# Patient Record
Sex: Female | Born: 1950 | Race: White | Hispanic: No | Marital: Married | Smoking: Never smoker
Health system: Southern US, Community
[De-identification: ages and names within clinical notes are randomized; demographics above are authoritative.]

## PROBLEM LIST (undated history)

## (undated) DIAGNOSIS — K219 Gastro-esophageal reflux disease without esophagitis: Secondary | ICD-10-CM

## (undated) DIAGNOSIS — F329 Major depressive disorder, single episode, unspecified: Secondary | ICD-10-CM

## (undated) DIAGNOSIS — R51 Headache: Secondary | ICD-10-CM

## (undated) DIAGNOSIS — G40909 Epilepsy, unspecified, not intractable, without status epilepticus: Secondary | ICD-10-CM

## (undated) DIAGNOSIS — B029 Zoster without complications: Secondary | ICD-10-CM

## (undated) DIAGNOSIS — E538 Deficiency of other specified B group vitamins: Secondary | ICD-10-CM

## (undated) DIAGNOSIS — R569 Unspecified convulsions: Secondary | ICD-10-CM

## (undated) DIAGNOSIS — F32A Depression, unspecified: Secondary | ICD-10-CM

## (undated) DIAGNOSIS — R519 Headache, unspecified: Secondary | ICD-10-CM

## (undated) DIAGNOSIS — E039 Hypothyroidism, unspecified: Secondary | ICD-10-CM

## (undated) DIAGNOSIS — G4730 Sleep apnea, unspecified: Secondary | ICD-10-CM

## (undated) DIAGNOSIS — E785 Hyperlipidemia, unspecified: Secondary | ICD-10-CM

## (undated) DIAGNOSIS — N200 Calculus of kidney: Secondary | ICD-10-CM

## (undated) DIAGNOSIS — F419 Anxiety disorder, unspecified: Secondary | ICD-10-CM

## (undated) DIAGNOSIS — E7800 Pure hypercholesterolemia, unspecified: Secondary | ICD-10-CM

## (undated) DIAGNOSIS — H5340 Unspecified visual field defects: Secondary | ICD-10-CM

## (undated) DIAGNOSIS — M1990 Unspecified osteoarthritis, unspecified site: Secondary | ICD-10-CM

## (undated) DIAGNOSIS — K589 Irritable bowel syndrome without diarrhea: Secondary | ICD-10-CM

## (undated) DIAGNOSIS — N393 Stress incontinence (female) (male): Secondary | ICD-10-CM

## (undated) HISTORY — PX: TONSILLECTOMY: SUR1361

## (undated) HISTORY — PX: CARPAL TUNNEL RELEASE: SHX101

---

## 2004-12-29 ENCOUNTER — Ambulatory Visit: Payer: Self-pay | Admitting: Internal Medicine

## 2005-04-23 ENCOUNTER — Ambulatory Visit: Payer: Self-pay | Admitting: Unknown Physician Specialty

## 2005-09-11 ENCOUNTER — Other Ambulatory Visit: Payer: Self-pay

## 2005-09-11 ENCOUNTER — Inpatient Hospital Stay: Payer: Self-pay | Admitting: Internal Medicine

## 2006-01-17 ENCOUNTER — Ambulatory Visit: Payer: Self-pay | Admitting: Internal Medicine

## 2007-03-27 ENCOUNTER — Ambulatory Visit: Payer: Self-pay | Admitting: Internal Medicine

## 2008-05-11 ENCOUNTER — Ambulatory Visit: Payer: Self-pay | Admitting: Internal Medicine

## 2009-06-16 ENCOUNTER — Ambulatory Visit: Payer: Self-pay | Admitting: Internal Medicine

## 2009-06-22 ENCOUNTER — Ambulatory Visit: Payer: Self-pay | Admitting: Internal Medicine

## 2010-01-21 ENCOUNTER — Emergency Department: Payer: Self-pay | Admitting: Unknown Physician Specialty

## 2010-02-01 ENCOUNTER — Ambulatory Visit: Payer: Self-pay | Admitting: Internal Medicine

## 2010-07-03 ENCOUNTER — Ambulatory Visit: Payer: Self-pay | Admitting: Unknown Physician Specialty

## 2010-08-15 ENCOUNTER — Ambulatory Visit: Payer: Self-pay | Admitting: Internal Medicine

## 2010-09-26 ENCOUNTER — Ambulatory Visit: Payer: Self-pay | Admitting: Internal Medicine

## 2011-07-10 ENCOUNTER — Ambulatory Visit: Payer: Self-pay | Admitting: Unknown Physician Specialty

## 2011-08-10 ENCOUNTER — Ambulatory Visit: Payer: Self-pay | Admitting: Internal Medicine

## 2011-09-14 ENCOUNTER — Ambulatory Visit: Payer: Self-pay | Admitting: Internal Medicine

## 2011-09-21 ENCOUNTER — Ambulatory Visit: Payer: Self-pay | Admitting: Internal Medicine

## 2011-11-14 ENCOUNTER — Ambulatory Visit: Payer: Self-pay | Admitting: Ophthalmology

## 2011-11-14 LAB — CREATININE, SERUM
Creatinine: 0.51 mg/dL — ABNORMAL LOW (ref 0.60–1.30)
EGFR (Non-African Amer.): >60

## 2012-10-01 ENCOUNTER — Ambulatory Visit: Payer: Self-pay | Admitting: Internal Medicine

## 2013-10-20 ENCOUNTER — Ambulatory Visit: Payer: Self-pay | Admitting: Internal Medicine

## 2014-11-10 ENCOUNTER — Ambulatory Visit
Admit: 2014-11-10 | Disposition: A | Discharge status: Home / Self Care | Payer: Self-pay | Attending: Internal Medicine | Admitting: Internal Medicine

## 2015-06-06 ENCOUNTER — Other Ambulatory Visit: Payer: Self-pay | Admitting: Nurse Practitioner

## 2015-06-06 DIAGNOSIS — R140 Abdominal distension (gaseous): Secondary | ICD-10-CM

## 2015-06-06 DIAGNOSIS — R1031 Right lower quadrant pain: Secondary | ICD-10-CM

## 2015-06-17 ENCOUNTER — Ambulatory Visit: Payer: Self-pay

## 2015-07-08 ENCOUNTER — Ambulatory Visit: Admit: 2015-07-08 | Payer: Self-pay | Admitting: Unknown Physician Specialty

## 2015-07-08 SURGERY — COLONOSCOPY WITH PROPOFOL
Anesthesia: General

## 2015-08-19 ENCOUNTER — Encounter: Payer: Self-pay | Admitting: *Deleted

## 2015-08-22 ENCOUNTER — Ambulatory Visit
Admission: RE | Admit: 2015-08-22 | Discharge: 2015-08-22 | Disposition: A | Discharge status: Home / Self Care | Payer: BLUE CROSS/BLUE SHIELD | Source: Ambulatory Visit | Attending: Unknown Physician Specialty | Admitting: Unknown Physician Specialty

## 2015-08-22 ENCOUNTER — Encounter
Admission: RE | Disposition: A | Discharge status: Home / Self Care | Payer: Self-pay | Source: Ambulatory Visit | Attending: Unknown Physician Specialty

## 2015-08-22 ENCOUNTER — Ambulatory Visit: Payer: BLUE CROSS/BLUE SHIELD | Admitting: Anesthesiology

## 2015-08-22 ENCOUNTER — Encounter: Payer: Self-pay | Admitting: *Deleted

## 2015-08-22 DIAGNOSIS — E7800 Pure hypercholesterolemia, unspecified: Secondary | ICD-10-CM | POA: Diagnosis not present

## 2015-08-22 DIAGNOSIS — E039 Hypothyroidism, unspecified: Secondary | ICD-10-CM | POA: Diagnosis not present

## 2015-08-22 DIAGNOSIS — R51 Headache: Secondary | ICD-10-CM | POA: Diagnosis not present

## 2015-08-22 DIAGNOSIS — K640 First degree hemorrhoids: Secondary | ICD-10-CM | POA: Insufficient documentation

## 2015-08-22 DIAGNOSIS — R1013 Epigastric pain: Secondary | ICD-10-CM | POA: Insufficient documentation

## 2015-08-22 DIAGNOSIS — E538 Deficiency of other specified B group vitamins: Secondary | ICD-10-CM | POA: Diagnosis not present

## 2015-08-22 DIAGNOSIS — G4730 Sleep apnea, unspecified: Secondary | ICD-10-CM | POA: Diagnosis not present

## 2015-08-22 DIAGNOSIS — Z800 Family history of malignant neoplasm of digestive organs: Secondary | ICD-10-CM | POA: Insufficient documentation

## 2015-08-22 DIAGNOSIS — Z87442 Personal history of urinary calculi: Secondary | ICD-10-CM | POA: Insufficient documentation

## 2015-08-22 DIAGNOSIS — H547 Unspecified visual loss: Secondary | ICD-10-CM | POA: Insufficient documentation

## 2015-08-22 DIAGNOSIS — Z79899 Other long term (current) drug therapy: Secondary | ICD-10-CM | POA: Insufficient documentation

## 2015-08-22 DIAGNOSIS — E785 Hyperlipidemia, unspecified: Secondary | ICD-10-CM | POA: Diagnosis not present

## 2015-08-22 DIAGNOSIS — N393 Stress incontinence (female) (male): Secondary | ICD-10-CM | POA: Diagnosis not present

## 2015-08-22 DIAGNOSIS — K210 Gastro-esophageal reflux disease with esophagitis: Secondary | ICD-10-CM | POA: Diagnosis not present

## 2015-08-22 DIAGNOSIS — R1084 Generalized abdominal pain: Secondary | ICD-10-CM | POA: Insufficient documentation

## 2015-08-22 DIAGNOSIS — D122 Benign neoplasm of ascending colon: Secondary | ICD-10-CM | POA: Insufficient documentation

## 2015-08-22 DIAGNOSIS — Z8669 Personal history of other diseases of the nervous system and sense organs: Secondary | ICD-10-CM | POA: Insufficient documentation

## 2015-08-22 DIAGNOSIS — Z1211 Encounter for screening for malignant neoplasm of colon: Secondary | ICD-10-CM | POA: Diagnosis present

## 2015-08-22 DIAGNOSIS — G40909 Epilepsy, unspecified, not intractable, without status epilepticus: Secondary | ICD-10-CM | POA: Diagnosis not present

## 2015-08-22 DIAGNOSIS — K589 Irritable bowel syndrome without diarrhea: Secondary | ICD-10-CM | POA: Insufficient documentation

## 2015-08-22 DIAGNOSIS — F329 Major depressive disorder, single episode, unspecified: Secondary | ICD-10-CM | POA: Diagnosis not present

## 2015-08-22 DIAGNOSIS — M1990 Unspecified osteoarthritis, unspecified site: Secondary | ICD-10-CM | POA: Insufficient documentation

## 2015-08-22 HISTORY — DX: Pure hypercholesterolemia, unspecified: E78.00

## 2015-08-22 HISTORY — DX: Unspecified osteoarthritis, unspecified site: M19.90

## 2015-08-22 HISTORY — DX: Calculus of kidney: N20.0

## 2015-08-22 HISTORY — DX: Unspecified convulsions: R56.9

## 2015-08-22 HISTORY — DX: Hypothyroidism, unspecified: E03.9

## 2015-08-22 HISTORY — DX: Sleep apnea, unspecified: G47.30

## 2015-08-22 HISTORY — DX: Headache: R51

## 2015-08-22 HISTORY — PX: ESOPHAGOGASTRODUODENOSCOPY (EGD) WITH PROPOFOL: SHX5813

## 2015-08-22 HISTORY — DX: Unspecified visual field defects: H53.40

## 2015-08-22 HISTORY — DX: Deficiency of other specified B group vitamins: E53.8

## 2015-08-22 HISTORY — DX: Depression, unspecified: F32.A

## 2015-08-22 HISTORY — DX: Gastro-esophageal reflux disease without esophagitis: K21.9

## 2015-08-22 HISTORY — DX: Stress incontinence (female) (male): N39.3

## 2015-08-22 HISTORY — DX: Hyperlipidemia, unspecified: E78.5

## 2015-08-22 HISTORY — DX: Major depressive disorder, single episode, unspecified: F32.9

## 2015-08-22 HISTORY — PX: COLONOSCOPY WITH PROPOFOL: SHX5780

## 2015-08-22 HISTORY — DX: Zoster without complications: B02.9

## 2015-08-22 HISTORY — DX: Headache, unspecified: R51.9

## 2015-08-22 HISTORY — DX: Epilepsy, unspecified, not intractable, without status epilepticus: G40.909

## 2015-08-22 HISTORY — DX: Irritable bowel syndrome, unspecified: K58.9

## 2015-08-22 SURGERY — COLONOSCOPY WITH PROPOFOL
Anesthesia: General

## 2015-08-22 MED ORDER — FENTANYL CITRATE (PF) 100 MCG/2ML IJ SOLN
INTRAMUSCULAR | Status: DC | PRN
  Administered 2015-08-22: 50 ug via INTRAVENOUS

## 2015-08-22 MED ORDER — LIDOCAINE HCL (CARDIAC) 20 MG/ML IV SOLN
INTRAVENOUS | Status: DC | PRN
  Administered 2015-08-22: 100 mg via INTRAVENOUS

## 2015-08-22 MED ORDER — ACETAMINOPHEN 500 MG PO TABS
ORAL_TABLET | ORAL | Status: AC
  Filled 2015-08-22: qty 2

## 2015-08-22 MED ORDER — GLYCOPYRROLATE 0.2 MG/ML IJ SOLN
INTRAMUSCULAR | Status: DC | PRN
  Administered 2015-08-22: 0.2 mg via INTRAVENOUS

## 2015-08-22 MED ORDER — PROPOFOL 500 MG/50ML IV EMUL
INTRAVENOUS | Status: DC | PRN
  Administered 2015-08-22: 70 ug/kg/min via INTRAVENOUS

## 2015-08-22 MED ORDER — PROPOFOL 10 MG/ML IV BOLUS
INTRAVENOUS | Status: DC | PRN
  Administered 2015-08-22: 80 mg via INTRAVENOUS
  Administered 2015-08-22: 13 mg via INTRAVENOUS

## 2015-08-22 MED ORDER — MIDAZOLAM HCL 5 MG/5ML IJ SOLN
INTRAMUSCULAR | Status: DC | PRN
  Administered 2015-08-22: 1 mg via INTRAVENOUS

## 2015-08-22 MED ORDER — SODIUM CHLORIDE 0.9 % IV SOLN: INTRAVENOUS | Status: DC

## 2015-08-22 MED ORDER — SODIUM CHLORIDE 0.9 % IV SOLN
INTRAVENOUS | Status: DC
  Administered 2015-08-22: 1000 mL via INTRAVENOUS

## 2015-08-22 NOTE — H&P (Signed)
Primary Care Physician:  Adin Hector, MD Primary Gastroenterologist:  Dr. Vira Agar  Pre-Procedure History & Physical: HPI:  Morgan Good is a 65 y.o. female is here for an endoscopy and colonoscopy.   Past Medical History  Diagnosis Date  . Arthritis   . B12 deficiency   . Depression   . Epilepsy ([place])   . GERD (gastroesophageal reflux disease)   . Shingles   . Hypothyroidism   . IBS (irritable bowel syndrome)   . Headache   . Hyperlipidemia   . Hypercholesterolemia   . Renal stones   . Seizures ([place])   . Sleep apnea   . Stress incontinence   . Hypothyroidism   . Visual field loss     History reviewed. No pertinent past surgical history.  Prior to Admission medications   Medication Sig Start Date End Date Taking? Authorizing Provider  lamoTRIgine (LAMICTAL) 100 MG tablet Take 100 mg by mouth daily.   Yes Historical Provider, MD  levothyroxine (SYNTHROID, LEVOTHROID) 75 MCG tablet Take 75 mcg by mouth daily before breakfast.   Yes Historical Provider, MD  Linaclotide (LINZESS) 145 MCG CAPS capsule Take 145 mcg by mouth daily.   Yes Historical Provider, MD  lovastatin (MEVACOR) 40 MG tablet Take 40 mg by mouth at bedtime.   Yes Historical Provider, MD  pantoprazole (PROTONIX) 40 MG tablet Take 40 mg by mouth daily.   Yes Historical Provider, MD  venlafaxine (EFFEXOR) 75 MG tablet Take 75 mg by mouth 2 (two) times daily.   Yes Historical Provider, MD  vitamin B-12 (CYANOCOBALAMIN) 1000 MCG tablet Take 1,000 mcg by mouth daily.   Yes Historical Provider, MD  zolpidem (AMBIEN) 5 MG tablet Take 5 mg by mouth at bedtime as needed for sleep.   Yes Historical Provider, MD  ondansetron (ZOFRAN-ODT) 4 MG disintegrating tablet Take 4 mg by mouth every 8 (eight) hours as needed for nausea or vomiting. Reported on 08/22/2015    Historical Provider, MD    Allergies as of 07/26/2015  . (Not on File)    History reviewed. No pertinent family history.  Social History   Social  History  . Marital Status: Married    Spouse Name: N/A  . Number of Children: N/A  . Years of Education: N/A   Occupational History  . Not on file.   Social History Main Topics  . Smoking status: Never Smoker   . Smokeless tobacco: Never Used  . Alcohol Use: 1.2 oz/week    2 Glasses of wine per week  . Drug Use: Not on file  . Sexual Activity: Not on file   Other Topics Concern  . Not on file   Social History Narrative    Review of Systems: See HPI, otherwise negative ROS  Physical Exam: BP 145/73 mmHg  Pulse 61  Temp(Src) 98.4 F (36.9 C) (Tympanic)  Resp 14  Ht 5\' 1"  (1.549 m)  Wt 64.864 kg (143 lb)  BMI 27.03 kg/m2  SpO2 100% General:   Alert,  pleasant and cooperative in NAD Head:  Normocephalic and atraumatic. Neck:  Supple; no masses or thyromegaly. Lungs:  Clear throughout to auscultation.    Heart:  Regular rate and rhythm. Abdomen:  Soft, nontender and nondistended. Normal bowel sounds, without guarding, and without rebound.   Neurologic:  Alert and  oriented x4;  grossly normal neurologically.  Impression/Plan: Morgan Good is here for an endoscopy and colonoscopy to be performed for [facility] in sister and GERD  and generalized abd pain  Risks, benefits, limitations, and alternatives regarding  endoscopy and colonoscopy have been reviewed with the patient.  Questions have been answered.  All parties agreeable.   Gaylyn Cheers, MD  08/22/2015, 3:27 PM

## 2015-08-22 NOTE — Op Note (Signed)
[facility] Patient Name: Morgan Good Procedure Date: 08/22/2015 3:30 PM MRN: [id number] Account #: [id number] Date of Birth: 1950/09/22 Admit Type: Outpatient Age: 65 Room: [facility] ENDO ROOM 1 Gender: Female Note Status: Finalized Procedure:         Upper GI endoscopy Indications:       Epigastric abdominal pain, Heartburn Providers:         Manya Silvas, MD Referring MD:      Ramonita Lab, MD (Referring MD) Medicines:         Propofol per Anesthesia Complications:     No immediate complications. Procedure:         Pre-Anesthesia Assessment:                    - After reviewing the risks and benefits, the patient was                     deemed in satisfactory condition to undergo the procedure.                    After obtaining informed consent, the endoscope was passed                     under direct vision. Throughout the procedure, the                     patient's blood pressure, pulse, and oxygen saturations                     were monitored continuously. The Endoscope was introduced                     through the mouth, and advanced to the second part of                     duodenum. The upper GI endoscopy was accomplished without                     difficulty. The patient tolerated the procedure well. Findings:      LA Grade A (one or more mucosal breaks less than 5 mm, not extending       between tops of 2 mucosal folds) esophagitis with no bleeding was found       39 cm from the incisors. Biopsies were taken from GEJ with a cold       forceps for histology.      The entire examined stomach was normal. Biopsies were taken with a cold       forceps for histology. Biopsies were taken with a cold forceps for       Helicobacter pylori testing.      Patchy mild inflammation characterized by erythema and granularity was       found in the duodenal bulb. Biopsies were taken with a cold forceps for       histology. Biopsies for histology  were taken with a cold forceps for for       evaluation of possible celiac disease.      The examined duodenum was normal. Biopsies for histology were taken with       a cold forceps for for evaluation of possible celiac disease. Impression:        - LA Grade A reflux esophagitis. Rule out Barrett's  esophagus. Biopsied.                    - Normal stomach. Biopsied.                    - Duodenitis. Biopsied.                    - Normal examined duodenum. Biopsied. Recommendation:    - Await pathology results. Manya Silvas, MD 08/22/2015 3:45:17 PM This report has been signed electronically. Number of Addenda: 0 Note Initiated On: 08/22/2015 3:30 PM      [facility]

## 2015-08-22 NOTE — Op Note (Signed)
[facility] Patient Name: Morgan Good Procedure Date: 08/22/2015 3:29 PM MRN: [id number] Account #: [id number] Date of Birth: 09/02/1950 Admit Type: Outpatient Age: 65 Room: [facility] ENDO ROOM 1 Gender: Female Note Status: Finalized Procedure:         Colonoscopy Indications:       Screening in patient at increased risk: Family history of                     1st-degree relative with colorectal cancer Providers:         Manya Silvas, MD Referring MD:      Ramonita Lab, MD (Referring MD) Medicines:         Propofol per Anesthesia Complications:     No immediate complications. Procedure:         Pre-Anesthesia Assessment:                    - After reviewing the risks and benefits, the patient was                     deemed in satisfactory condition to undergo the procedure.                    After obtaining informed consent, the colonoscope was                     passed under direct vision. Throughout the procedure, the                     patient's blood pressure, pulse, and oxygen saturations                     were monitored continuously. The Colonoscope was                     introduced through the anus and advanced to the the cecum,                     identified by appendiceal orifice and ileocecal valve. The                     colonoscopy was somewhat difficult due to a tortuous                     colon. The patient tolerated the procedure well. The                     quality of the bowel preparation was excellent. Findings:      A diminutive polyp was found in the ascending colon. The polyp was       sessile. The polyp was removed with a cold biopsy forceps. Resection and       retrieval were complete.      Internal hemorrhoids were found during endoscopy. The hemorrhoids were       small and Grade I (internal hemorrhoids that do not prolapse).      The exam was otherwise without abnormality. Impression:        - One diminutive polyp  in the ascending colon. Resected                     and retrieved.                    - Internal hemorrhoids.                    -  The examination was otherwise normal. Recommendation:    - Await pathology results. Manya Silvas, MD 08/22/2015 4:11:01 PM This report has been signed electronically. Number of Addenda: 0 Note Initiated On: 08/22/2015 3:29 PM Scope Withdrawal Time: 0 hours 13 minutes 49 seconds  Total Procedure Duration: 0 hours 21 minutes 56 seconds       [facility]

## 2015-08-22 NOTE — Anesthesia Preprocedure Evaluation (Signed)
Anesthesia Evaluation  Patient identified by MRN, date of birth, ID band Patient awake    Reviewed: Allergy & Precautions, H&P , NPO status , Patient's Chart, lab work & pertinent test results, reviewed documented beta blocker date and time   History of Anesthesia Complications Negative for: history of anesthetic complications  Airway Mallampati: II  TM Distance: >3 FB Neck ROM: full    Dental no notable dental hx. (+) Caps, Missing   Pulmonary neg shortness of breath, sleep apnea and Continuous Positive Airway Pressure Ventilation , neg COPD, neg recent URI,    Pulmonary exam normal breath sounds clear to auscultation       Cardiovascular Exercise Tolerance: Good negative cardio ROS Normal cardiovascular exam Rhythm:regular Rate:Normal     Neuro/Psych Seizures - (1 episode 6 years ago), Well Controlled,  PSYCHIATRIC DISORDERS (Depression) negative psych ROS   GI/Hepatic Neg liver ROS, GERD  Medicated and Controlled,  Endo/Other  neg diabetesHypothyroidism   Renal/GU Renal disease (kidney stones)  negative genitourinary   Musculoskeletal   Abdominal   Peds  Hematology negative hematology ROS (+)   Anesthesia Other Findings Past Medical History:   Arthritis                                                    B12 deficiency                                               Depression                                                   Epilepsy (HCC)                                               GERD (gastroesophageal reflux disease)                       Shingles                                                     Hypothyroidism                                               IBS (irritable bowel syndrome)                               Headache  Hyperlipidemia                                               Hypercholesterolemia                                         Renal  stones                                                 Seizures (HCC)                                               Sleep apnea                                                  Stress incontinence                                          Hypothyroidism                                               Visual field loss                                            Reproductive/Obstetrics negative OB ROS                             Anesthesia Physical Anesthesia Plan  ASA: II  Anesthesia Plan: General   Post-op Pain Management:    Induction:   Airway Management Planned:   Additional Equipment:   Intra-op Plan:   Post-operative Plan:   Informed Consent: I have reviewed the patients History and Physical, chart, labs and discussed the procedure including the risks, benefits and alternatives for the proposed anesthesia with the patient or authorized representative who has indicated his/her understanding and acceptance.   Dental Advisory Given  Plan Discussed with: Anesthesiologist, CRNA and Surgeon  Anesthesia Plan Comments:         Anesthesia Quick Evaluation

## 2015-08-22 NOTE — Anesthesia Postprocedure Evaluation (Signed)
Anesthesia Post Note  Patient: Morgan Good  Procedure(s) Performed: Procedure(s) (LRB): COLONOSCOPY WITH PROPOFOL (N/A) ESOPHAGOGASTRODUODENOSCOPY (EGD) WITH PROPOFOL (N/A)  Patient location during evaluation: Endoscopy Anesthesia Type: General Level of consciousness: awake Pain management: pain level controlled Respiratory status: spontaneous breathing Cardiovascular status: blood pressure returned to baseline Anesthetic complications: no    Last Vitals:  Filed Vitals:   08/22/15 1635 08/22/15 1645  BP: 119/76 139/85  Pulse: 72 68  Temp:    Resp: 12 17    Last Pain: There were no vitals filed for this visit.               Kayani Rapaport S

## 2015-08-22 NOTE — Transfer of Care (Signed)
Immediate Anesthesia Transfer of Care Note  Patient: Morgan Good  Procedure(s) Performed: Procedure(s): COLONOSCOPY WITH PROPOFOL (N/A) ESOPHAGOGASTRODUODENOSCOPY (EGD) WITH PROPOFOL (N/A)  Patient Location: PACU  Anesthesia Type:General  Level of Consciousness: sedated  Airway & Oxygen Therapy: Patient Spontanous Breathing and Patient connected to nasal cannula oxygen  Post-op Assessment: Report given to RN and Post -op Vital signs reviewed and stable  Post vital signs: Reviewed and stable  Last Vitals:  Filed Vitals:   08/22/15 1414 08/22/15 1616  BP: 145/73 124/76  Pulse: 61 76  Temp: 36.9 C 36 C  Resp: 14 17    Complications: No apparent anesthesia complications

## 2015-08-24 ENCOUNTER — Encounter: Payer: Self-pay | Admitting: Unknown Physician Specialty

## 2015-08-25 LAB — SURGICAL PATHOLOGY

## 2016-03-19 ENCOUNTER — Other Ambulatory Visit: Payer: Self-pay | Admitting: Internal Medicine

## 2016-03-19 DIAGNOSIS — E538 Deficiency of other specified B group vitamins: Secondary | ICD-10-CM | POA: Diagnosis not present

## 2016-03-19 DIAGNOSIS — R739 Hyperglycemia, unspecified: Secondary | ICD-10-CM | POA: Diagnosis not present

## 2016-03-19 DIAGNOSIS — Z1231 Encounter for screening mammogram for malignant neoplasm of breast: Secondary | ICD-10-CM

## 2016-03-19 DIAGNOSIS — R569 Unspecified convulsions: Secondary | ICD-10-CM | POA: Diagnosis not present

## 2016-03-19 DIAGNOSIS — K581 Irritable bowel syndrome with constipation: Secondary | ICD-10-CM | POA: Diagnosis not present

## 2016-03-19 DIAGNOSIS — F3342 Major depressive disorder, recurrent, in full remission: Secondary | ICD-10-CM | POA: Diagnosis not present

## 2016-03-19 DIAGNOSIS — Z1239 Encounter for other screening for malignant neoplasm of breast: Secondary | ICD-10-CM | POA: Diagnosis not present

## 2016-03-19 DIAGNOSIS — K219 Gastro-esophageal reflux disease without esophagitis: Secondary | ICD-10-CM | POA: Diagnosis not present

## 2016-03-19 DIAGNOSIS — E034 Atrophy of thyroid (acquired): Secondary | ICD-10-CM | POA: Diagnosis not present

## 2016-03-19 DIAGNOSIS — E7800 Pure hypercholesterolemia, unspecified: Secondary | ICD-10-CM | POA: Diagnosis not present

## 2016-03-19 DIAGNOSIS — G4733 Obstructive sleep apnea (adult) (pediatric): Secondary | ICD-10-CM | POA: Diagnosis not present

## 2016-03-19 DIAGNOSIS — D72818 Other decreased white blood cell count: Secondary | ICD-10-CM | POA: Diagnosis not present

## 2016-03-19 DIAGNOSIS — Z9989 Dependence on other enabling machines and devices: Secondary | ICD-10-CM | POA: Diagnosis not present

## 2016-04-06 ENCOUNTER — Ambulatory Visit
Admission: RE | Admit: 2016-04-06 | Discharge: 2016-04-06 | Disposition: A | Discharge status: Home / Self Care | Payer: PPO | Source: Ambulatory Visit | Attending: Internal Medicine | Admitting: Internal Medicine

## 2016-04-06 DIAGNOSIS — Z1231 Encounter for screening mammogram for malignant neoplasm of breast: Secondary | ICD-10-CM | POA: Diagnosis not present

## 2016-04-13 DIAGNOSIS — R238 Other skin changes: Secondary | ICD-10-CM | POA: Diagnosis not present

## 2016-04-13 DIAGNOSIS — L538 Other specified erythematous conditions: Secondary | ICD-10-CM | POA: Diagnosis not present

## 2016-04-13 DIAGNOSIS — L820 Inflamed seborrheic keratosis: Secondary | ICD-10-CM | POA: Diagnosis not present

## 2016-04-13 DIAGNOSIS — L821 Other seborrheic keratosis: Secondary | ICD-10-CM | POA: Diagnosis not present

## 2016-04-13 DIAGNOSIS — L298 Other pruritus: Secondary | ICD-10-CM | POA: Diagnosis not present

## 2016-04-13 DIAGNOSIS — Z1283 Encounter for screening for malignant neoplasm of skin: Secondary | ICD-10-CM | POA: Diagnosis not present

## 2016-04-13 DIAGNOSIS — D485 Neoplasm of uncertain behavior of skin: Secondary | ICD-10-CM | POA: Diagnosis not present

## 2016-05-01 DIAGNOSIS — S3992XA Unspecified injury of lower back, initial encounter: Secondary | ICD-10-CM | POA: Diagnosis not present

## 2016-05-01 DIAGNOSIS — M533 Sacrococcygeal disorders, not elsewhere classified: Secondary | ICD-10-CM | POA: Diagnosis not present

## 2016-05-28 DIAGNOSIS — D225 Melanocytic nevi of trunk: Secondary | ICD-10-CM | POA: Diagnosis not present

## 2016-05-28 DIAGNOSIS — D485 Neoplasm of uncertain behavior of skin: Secondary | ICD-10-CM | POA: Diagnosis not present

## 2016-07-20 DIAGNOSIS — H40053 Ocular hypertension, bilateral: Secondary | ICD-10-CM | POA: Diagnosis not present

## 2016-08-10 DIAGNOSIS — E7800 Pure hypercholesterolemia, unspecified: Secondary | ICD-10-CM | POA: Diagnosis not present

## 2016-08-10 DIAGNOSIS — E034 Atrophy of thyroid (acquired): Secondary | ICD-10-CM | POA: Diagnosis not present

## 2016-08-10 DIAGNOSIS — R739 Hyperglycemia, unspecified: Secondary | ICD-10-CM | POA: Diagnosis not present

## 2016-08-10 DIAGNOSIS — E538 Deficiency of other specified B group vitamins: Secondary | ICD-10-CM | POA: Diagnosis not present

## 2016-08-17 DIAGNOSIS — Z124 Encounter for screening for malignant neoplasm of cervix: Secondary | ICD-10-CM | POA: Diagnosis not present

## 2016-08-17 DIAGNOSIS — E7800 Pure hypercholesterolemia, unspecified: Secondary | ICD-10-CM | POA: Diagnosis not present

## 2016-08-17 DIAGNOSIS — D72818 Other decreased white blood cell count: Secondary | ICD-10-CM | POA: Diagnosis not present

## 2016-08-17 DIAGNOSIS — Z780 Asymptomatic menopausal state: Secondary | ICD-10-CM | POA: Diagnosis not present

## 2016-08-17 DIAGNOSIS — Z0000 Encounter for general adult medical examination without abnormal findings: Secondary | ICD-10-CM | POA: Diagnosis not present

## 2016-08-17 DIAGNOSIS — K581 Irritable bowel syndrome with constipation: Secondary | ICD-10-CM | POA: Diagnosis not present

## 2016-08-17 DIAGNOSIS — E034 Atrophy of thyroid (acquired): Secondary | ICD-10-CM | POA: Diagnosis not present

## 2016-08-17 DIAGNOSIS — G4733 Obstructive sleep apnea (adult) (pediatric): Secondary | ICD-10-CM | POA: Diagnosis not present

## 2016-08-17 DIAGNOSIS — R569 Unspecified convulsions: Secondary | ICD-10-CM | POA: Diagnosis not present

## 2016-08-17 DIAGNOSIS — R739 Hyperglycemia, unspecified: Secondary | ICD-10-CM | POA: Diagnosis not present

## 2016-08-17 DIAGNOSIS — E538 Deficiency of other specified B group vitamins: Secondary | ICD-10-CM | POA: Diagnosis not present

## 2016-08-17 DIAGNOSIS — K219 Gastro-esophageal reflux disease without esophagitis: Secondary | ICD-10-CM | POA: Diagnosis not present

## 2016-08-17 DIAGNOSIS — F3342 Major depressive disorder, recurrent, in full remission: Secondary | ICD-10-CM | POA: Diagnosis not present

## 2016-08-31 DIAGNOSIS — Z780 Asymptomatic menopausal state: Secondary | ICD-10-CM | POA: Diagnosis not present

## 2016-10-15 DIAGNOSIS — L821 Other seborrheic keratosis: Secondary | ICD-10-CM | POA: Diagnosis not present

## 2016-10-15 DIAGNOSIS — D485 Neoplasm of uncertain behavior of skin: Secondary | ICD-10-CM | POA: Diagnosis not present

## 2016-10-15 DIAGNOSIS — C4490 Unspecified malignant neoplasm of skin, unspecified: Secondary | ICD-10-CM | POA: Diagnosis not present

## 2016-10-15 DIAGNOSIS — Z86018 Personal history of other benign neoplasm: Secondary | ICD-10-CM | POA: Diagnosis not present

## 2016-11-13 DIAGNOSIS — D229 Melanocytic nevi, unspecified: Secondary | ICD-10-CM | POA: Diagnosis not present

## 2016-11-13 DIAGNOSIS — D485 Neoplasm of uncertain behavior of skin: Secondary | ICD-10-CM | POA: Diagnosis not present

## 2017-01-18 DIAGNOSIS — H40053 Ocular hypertension, bilateral: Secondary | ICD-10-CM | POA: Diagnosis not present

## 2017-01-21 DIAGNOSIS — K581 Irritable bowel syndrome with constipation: Secondary | ICD-10-CM | POA: Diagnosis not present

## 2017-01-21 DIAGNOSIS — K219 Gastro-esophageal reflux disease without esophagitis: Secondary | ICD-10-CM | POA: Diagnosis not present

## 2017-01-25 DIAGNOSIS — H43813 Vitreous degeneration, bilateral: Secondary | ICD-10-CM | POA: Diagnosis not present

## 2017-02-15 DIAGNOSIS — E538 Deficiency of other specified B group vitamins: Secondary | ICD-10-CM | POA: Diagnosis not present

## 2017-02-15 DIAGNOSIS — R569 Unspecified convulsions: Secondary | ICD-10-CM | POA: Diagnosis not present

## 2017-02-15 DIAGNOSIS — R739 Hyperglycemia, unspecified: Secondary | ICD-10-CM | POA: Diagnosis not present

## 2017-02-15 DIAGNOSIS — E034 Atrophy of thyroid (acquired): Secondary | ICD-10-CM | POA: Diagnosis not present

## 2017-02-15 DIAGNOSIS — N183 Chronic kidney disease, stage 3 (moderate): Secondary | ICD-10-CM | POA: Diagnosis not present

## 2017-02-15 DIAGNOSIS — E7800 Pure hypercholesterolemia, unspecified: Secondary | ICD-10-CM | POA: Diagnosis not present

## 2017-02-22 ENCOUNTER — Other Ambulatory Visit: Payer: Self-pay | Admitting: Internal Medicine

## 2017-02-22 DIAGNOSIS — G4733 Obstructive sleep apnea (adult) (pediatric): Secondary | ICD-10-CM | POA: Diagnosis not present

## 2017-02-22 DIAGNOSIS — E538 Deficiency of other specified B group vitamins: Secondary | ICD-10-CM | POA: Diagnosis not present

## 2017-02-22 DIAGNOSIS — D72818 Other decreased white blood cell count: Secondary | ICD-10-CM | POA: Diagnosis not present

## 2017-02-22 DIAGNOSIS — R739 Hyperglycemia, unspecified: Secondary | ICD-10-CM | POA: Diagnosis not present

## 2017-02-22 DIAGNOSIS — Z1231 Encounter for screening mammogram for malignant neoplasm of breast: Secondary | ICD-10-CM | POA: Diagnosis not present

## 2017-02-22 DIAGNOSIS — K219 Gastro-esophageal reflux disease without esophagitis: Secondary | ICD-10-CM | POA: Diagnosis not present

## 2017-02-22 DIAGNOSIS — R1084 Generalized abdominal pain: Secondary | ICD-10-CM | POA: Diagnosis not present

## 2017-02-22 DIAGNOSIS — E034 Atrophy of thyroid (acquired): Secondary | ICD-10-CM | POA: Diagnosis not present

## 2017-02-22 DIAGNOSIS — F3342 Major depressive disorder, recurrent, in full remission: Secondary | ICD-10-CM | POA: Diagnosis not present

## 2017-02-22 DIAGNOSIS — Z9989 Dependence on other enabling machines and devices: Secondary | ICD-10-CM | POA: Diagnosis not present

## 2017-02-22 DIAGNOSIS — R109 Unspecified abdominal pain: Secondary | ICD-10-CM | POA: Diagnosis not present

## 2017-02-22 DIAGNOSIS — R569 Unspecified convulsions: Secondary | ICD-10-CM | POA: Diagnosis not present

## 2017-02-22 DIAGNOSIS — E7800 Pure hypercholesterolemia, unspecified: Secondary | ICD-10-CM | POA: Diagnosis not present

## 2017-04-15 DIAGNOSIS — R490 Dysphonia: Secondary | ICD-10-CM | POA: Diagnosis not present

## 2017-04-15 DIAGNOSIS — J019 Acute sinusitis, unspecified: Secondary | ICD-10-CM | POA: Diagnosis not present

## 2017-04-15 DIAGNOSIS — J301 Allergic rhinitis due to pollen: Secondary | ICD-10-CM | POA: Diagnosis not present

## 2017-04-16 ENCOUNTER — Ambulatory Visit
Admission: RE | Admit: 2017-04-16 | Discharge: 2017-04-16 | Disposition: A | Discharge status: Home / Self Care | Payer: PPO | Source: Ambulatory Visit | Attending: Internal Medicine | Admitting: Internal Medicine

## 2017-04-16 DIAGNOSIS — Z1231 Encounter for screening mammogram for malignant neoplasm of breast: Secondary | ICD-10-CM | POA: Diagnosis not present

## 2017-04-23 ENCOUNTER — Other Ambulatory Visit: Payer: Self-pay | Admitting: Nurse Practitioner

## 2017-04-23 DIAGNOSIS — R1084 Generalized abdominal pain: Secondary | ICD-10-CM

## 2017-04-23 DIAGNOSIS — K581 Irritable bowel syndrome with constipation: Secondary | ICD-10-CM | POA: Diagnosis not present

## 2017-05-10 DIAGNOSIS — R1084 Generalized abdominal pain: Secondary | ICD-10-CM | POA: Diagnosis not present

## 2017-05-21 DIAGNOSIS — L821 Other seborrheic keratosis: Secondary | ICD-10-CM | POA: Diagnosis not present

## 2017-05-21 DIAGNOSIS — B001 Herpesviral vesicular dermatitis: Secondary | ICD-10-CM | POA: Diagnosis not present

## 2017-05-21 DIAGNOSIS — L578 Other skin changes due to chronic exposure to nonionizing radiation: Secondary | ICD-10-CM | POA: Diagnosis not present

## 2017-05-21 DIAGNOSIS — Z86018 Personal history of other benign neoplasm: Secondary | ICD-10-CM | POA: Diagnosis not present

## 2017-05-21 DIAGNOSIS — L570 Actinic keratosis: Secondary | ICD-10-CM | POA: Diagnosis not present

## 2017-07-11 ENCOUNTER — Other Ambulatory Visit: Payer: Self-pay | Admitting: Nurse Practitioner

## 2017-07-11 DIAGNOSIS — R1033 Periumbilical pain: Secondary | ICD-10-CM

## 2017-07-23 ENCOUNTER — Ambulatory Visit (HOSPITAL_COMMUNITY): Payer: PPO

## 2017-07-23 DIAGNOSIS — H40053 Ocular hypertension, bilateral: Secondary | ICD-10-CM | POA: Diagnosis not present

## 2017-07-25 ENCOUNTER — Ambulatory Visit
Admission: RE | Admit: 2017-07-25 | Discharge: 2017-07-25 | Disposition: A | Discharge status: Home / Self Care | Payer: PPO | Source: Ambulatory Visit | Attending: Nurse Practitioner | Admitting: Nurse Practitioner

## 2017-07-25 ENCOUNTER — Other Ambulatory Visit: Payer: Self-pay

## 2017-07-25 ENCOUNTER — Emergency Department
Admission: EM | Admit: 2017-07-25 | Discharge: 2017-07-25 | Disposition: A | Discharge status: Home / Self Care | Payer: PPO | Attending: Emergency Medicine | Admitting: Emergency Medicine

## 2017-07-25 DIAGNOSIS — R109 Unspecified abdominal pain: Secondary | ICD-10-CM | POA: Diagnosis not present

## 2017-07-25 DIAGNOSIS — D259 Leiomyoma of uterus, unspecified: Secondary | ICD-10-CM

## 2017-07-25 DIAGNOSIS — R1033 Periumbilical pain: Secondary | ICD-10-CM | POA: Insufficient documentation

## 2017-07-25 DIAGNOSIS — T7840XA Allergy, unspecified, initial encounter: Secondary | ICD-10-CM | POA: Insufficient documentation

## 2017-07-25 DIAGNOSIS — E039 Hypothyroidism, unspecified: Secondary | ICD-10-CM | POA: Diagnosis not present

## 2017-07-25 DIAGNOSIS — Z79899 Other long term (current) drug therapy: Secondary | ICD-10-CM | POA: Diagnosis not present

## 2017-07-25 DIAGNOSIS — I700 Atherosclerosis of aorta: Secondary | ICD-10-CM | POA: Insufficient documentation

## 2017-07-25 MED ORDER — FAMOTIDINE IN NACL 20-0.9 MG/50ML-% IV SOLN
20.0000 mg | Freq: Once | INTRAVENOUS | Status: AC
  Administered 2017-07-25: 20 mg via INTRAVENOUS
  Filled 2017-07-25: qty 50

## 2017-07-25 MED ORDER — IOPAMIDOL (ISOVUE-370) INJECTION 76%
75.0000 mL | Freq: Once | INTRAVENOUS | Status: AC | PRN
  Administered 2017-07-25: 75 mL via INTRAVENOUS

## 2017-07-25 MED ORDER — DIPHENHYDRAMINE HCL 50 MG/ML IJ SOLN
25.0000 mg | Freq: Once | INTRAMUSCULAR | Status: AC
  Administered 2017-07-25: 25 mg via INTRAVENOUS
  Filled 2017-07-25: qty 1

## 2017-07-25 MED ORDER — METHYLPREDNISOLONE SODIUM SUCC 125 MG IJ SOLR
125.0000 mg | Freq: Once | INTRAMUSCULAR | Status: AC
  Administered 2017-07-25: 125 mg via INTRAVENOUS
  Filled 2017-07-25: qty 2

## 2017-07-25 MED ORDER — PREDNISONE 10 MG PO TABS: ORAL_TABLET | ORAL | 0 refills | Status: AC

## 2017-07-25 NOTE — ED Notes (Signed)
Pt ambulatory upon discharge. Pt verbalized understanding of discharge instructions, follow-up care and when to come back to the ER if needed. VSS. A&O x4. Skin warm and dry.

## 2017-07-25 NOTE — ED Triage Notes (Signed)
Patient brought over from CT due to allergic reaction after IVP dye administration.  Patient with clear bilateral breath sounds.  Patient denies shortness of breath or feeling of throat swelling.  Patient does complain of itching all over with hives developing on bilateral arms, bilateral legs, and truck. Patient was pre-treated with PO  prednisone and benadryl  prior to scheduled  out patient CT of abdomen for worsening symptoms of IBS.

## 2017-07-25 NOTE — ED Provider Notes (Signed)
[facility] Emergency Department Provider Note ____________________________________________   I have reviewed the triage vital signs and the triage nursing note.  HISTORY  Chief Complaint Allergic Reaction   Historian Patient  HPI Morgan Good is a 66 y.o. female who came for an outpatient CT scan of her abdomen with contrast due to irritable bowel type symptoms, and because of a history of prior CT dye allergy, patient did premedication with prednisone 3 times overnight p.o. during prep, and during CT scan patient states that she started to have this symptoms of allergic reaction including stuffy nose and congestion as well as sneezing, no wheezing or throat closing.  After the CT was completed, she then developed hives in her upper extremities and lower extremities which are itchy.  Patient was brought to the ED for further management of allergic reaction to CT dye.  Symptoms are moderate and continuing.   Past Medical History:  Diagnosis Date  . Arthritis   . B12 deficiency   . Depression   . Epilepsy ([place])   . GERD (gastroesophageal reflux disease)   . Headache   . Hypercholesterolemia   . Hyperlipidemia   . Hypothyroidism   . Hypothyroidism   . IBS (irritable bowel syndrome)   . Renal stones   . Seizures ([place])   . Shingles   . Sleep apnea   . Stress incontinence   . Visual field loss     There are no active problems to display for this patient.   Past Surgical History:  Procedure Laterality Date  . COLONOSCOPY WITH PROPOFOL N/A 08/22/2015   Procedure: COLONOSCOPY WITH PROPOFOL;  Surgeon: Manya Silvas, MD;  Location: [facility] ENDOSCOPY;  Service: Endoscopy;  Laterality: N/A;  . ESOPHAGOGASTRODUODENOSCOPY (EGD) WITH PROPOFOL N/A 08/22/2015   Procedure: ESOPHAGOGASTRODUODENOSCOPY (EGD) WITH PROPOFOL;  Surgeon: Manya Silvas, MD;  Location: [facility] ENDOSCOPY;  Service: Endoscopy;  Laterality: N/A;    Prior to Admission medications    Medication Sig Start Date End Date Taking? Authorizing Provider  lamoTRIgine (LAMICTAL) 100 MG tablet Take 100 mg by mouth daily.   Yes [provider]  levothyroxine (SYNTHROID, LEVOTHROID) 75 MCG tablet Take 75 mcg by mouth daily before breakfast.   Yes [provider]  Linaclotide (LINZESS) 145 MCG CAPS capsule Take 145 mcg by mouth daily.   Yes [provider]  lovastatin (MEVACOR) 40 MG tablet Take 40 mg by mouth at bedtime.   Yes [provider]  pantoprazole (PROTONIX) 40 MG tablet Take 40 mg by mouth daily.   Yes [provider]  Polyethyl Glycol-Propyl Glycol (SYSTANE OP) Place 1-2 drops into both eyes 2 (two) times daily.   Yes [provider]  predniSONE (DELTASONE) 50 MG tablet Take 50 mg by mouth daily with breakfast.   Yes [provider]  venlafaxine (EFFEXOR) 75 MG tablet Take 150 mg by mouth 2 (two) times daily.    Yes [provider]  vitamin B-12 (CYANOCOBALAMIN) 1000 MCG tablet Take 1,000 mcg by mouth daily.   Yes [provider]  predniSONE (DELTASONE) 10 MG tablet 40 mg by mouth for 2 days 30 mg by mouth for 2 days 20 mg by mouth for 2 days 10 mg by mouth for 2 days. 07/25/17   Lisa Roca, MD  traZODone (DESYREL) 100 MG tablet Take 100 mg by mouth at bedtime as needed for sleep.    [provider]    Allergies  Allergen Reactions  . Contrast Media [Iodinated [employer]] Hives    Hives and sneezing even after premedication.   . Meloxicam Nausea And Vomiting  . Sulfa Antibiotics Itching    Family History  Problem Relation Age of Onset  . Breast cancer Sister 66    Social History Social History   Tobacco Use  . Smoking status: Never Smoker  . Smokeless tobacco: Never Used  Substance Use Topics  . Alcohol use: Yes    Alcohol/week: 1.2 oz    Types: 2 Glasses of wine per week  . Drug use: Not on file    Review of Systems  Constitutional: Negative for  fever. Eyes: Negative for visual changes. ENT: Negative for sore throat.  She was having sneezing which is now pretty much gone. Cardiovascular: Negative for chest pain. Respiratory: Negative for shortness of breath. Gastrointestinal: Negative for abdominal pain, vomiting and diarrhea. Genitourinary: Negative for dysuria. Musculoskeletal: Negative for back pain. Skin: Hives and itching to upper and lower extremities. Neurological: Negative for headache.  ____________________________________________   PHYSICAL EXAM:  VITAL SIGNS: ED Triage Vitals  Enc Vitals Group     BP      Pulse      Resp      Temp      Temp src      SpO2      Weight      Height      Head Circumference      Peak Flow      Pain Score      Pain Loc      Pain Edu?      Excl. in [place]?      Constitutional: Alert and oriented. Well appearing and in no distress. HEENT   Head: Normocephalic and atraumatic.      Eyes: Conjunctivae are normal. Pupils equal and round.       Ears:         Nose: No congestion/rhinnorhea.   Mouth/Throat: Mucous membranes are moist.   Neck: No stridor. Cardiovascular/Chest: Normal rate, regular rhythm.  No murmurs, rubs, or gallops. Respiratory: Normal respiratory effort without tachypnea nor retractions. Breath sounds are clear and equal bilaterally. No wheezes/rales/rhonchi. Gastrointestinal: Soft. No distention, no guarding, no rebound. Nontender.    Genitourinary/rectal:Deferred Musculoskeletal: Nontender with normal range of motion in all extremities. No joint effusions.  No lower extremity tenderness.  No edema. Neurologic:  Normal speech and language. No gross or focal neurologic deficits are appreciated. Skin: Mild redness/hives to the right upper extremity and lower extremities. Psychiatric: Mood and affect are normal. Speech and behavior are normal. Patient exhibits appropriate insight and judgment.   ____________________________________________  LABS  (pertinent positives/negatives) I, Lisa Roca, MD the attending physician have reviewed the labs noted below.  Labs Reviewed - No data to display  ____________________________________________    EKG I, Lisa Roca, MD, the attending physician have personally viewed and interpreted all ECGs.  None  _________________________________________  RADIOLOGY All Xrays were viewed by me.  Imaging interpreted by Radiologist, and I, Lisa Roca, MD the attending physician have reviewed the radiologist interpretation noted below.  None __________________________________________  PROCEDURES  Procedure(s) performed: None  Critical Care performed: None   ____________________________________________  ED COURSE / ASSESSMENT AND PLAN  Pertinent labs & imaging results that were available during my care of the patient were reviewed by me and considered in my medical decision making (see chart for details).   I came to the patient's bedside when the nurse informed the patient came over from CT with allergic  reaction to CT dye.  She did do a premedication protocol overnight, but developed sneezing/congestion without airway closing or wheezing, and now has hives.  She is currently not having any airway symptoms or hypotension, I am not suspicious of anaphylaxis at present.  However she is having a diffuse generalized reaction and I am to give her IV doses of Benadryl as well as Solu-Medrol and Pepcid.  We discussed she may take over-the-counter Pepcid for 5 days, as well as Benadryl use as directed on labeling for 5 days for any itching or rash.   Reevaluation around noon, patient has no symptoms and we discussed biphasic reactions, patient feels comfortable going home.  CONSULTATIONS:   None   Patient / Family / Caregiver informed of clinical course, medical decision-making process, and agree with plan.  I discussed return precautions, follow-up instructions, and discharge  instructions with patient and/or family.  Discharge Instructions : You are evaluated for allergic reaction to CT contrast dye.  Return to the emergency department immediately for any return or worsening of symptoms including any trouble breathing, wheezing, throat swelling, or any other symptoms concerning to you.    ___________________________________________   FINAL CLINICAL IMPRESSION(S) / ED DIAGNOSES   Final diagnoses:  Allergic reaction, initial encounter      ___________________________________________        Note: This dictation was prepared with Dragon dictation. Any transcriptional errors that result from this process are unintentional    Lisa Roca, MD 07/25/17 1203

## 2017-07-25 NOTE — Discharge Instructions (Signed)
You are evaluated for allergic reaction to CT contrast dye.  Return to the emergency department immediately for any return or worsening of symptoms including any trouble breathing, wheezing, throat swelling, or any other symptoms concerning to you.

## 2017-08-14 ENCOUNTER — Other Ambulatory Visit: Payer: Self-pay | Admitting: Nurse Practitioner

## 2017-08-14 DIAGNOSIS — R1011 Right upper quadrant pain: Secondary | ICD-10-CM

## 2017-08-14 DIAGNOSIS — R140 Abdominal distension (gaseous): Secondary | ICD-10-CM | POA: Diagnosis not present

## 2017-08-14 DIAGNOSIS — K5900 Constipation, unspecified: Secondary | ICD-10-CM | POA: Diagnosis not present

## 2017-08-14 DIAGNOSIS — R1084 Generalized abdominal pain: Secondary | ICD-10-CM | POA: Diagnosis not present

## 2017-08-20 DIAGNOSIS — E7800 Pure hypercholesterolemia, unspecified: Secondary | ICD-10-CM | POA: Diagnosis not present

## 2017-08-20 DIAGNOSIS — E034 Atrophy of thyroid (acquired): Secondary | ICD-10-CM | POA: Diagnosis not present

## 2017-08-20 DIAGNOSIS — R739 Hyperglycemia, unspecified: Secondary | ICD-10-CM | POA: Diagnosis not present

## 2017-08-20 DIAGNOSIS — E538 Deficiency of other specified B group vitamins: Secondary | ICD-10-CM | POA: Diagnosis not present

## 2017-08-28 DIAGNOSIS — G4733 Obstructive sleep apnea (adult) (pediatric): Secondary | ICD-10-CM | POA: Diagnosis not present

## 2017-08-28 DIAGNOSIS — F3342 Major depressive disorder, recurrent, in full remission: Secondary | ICD-10-CM | POA: Diagnosis not present

## 2017-08-28 DIAGNOSIS — E538 Deficiency of other specified B group vitamins: Secondary | ICD-10-CM | POA: Diagnosis not present

## 2017-08-28 DIAGNOSIS — Z0000 Encounter for general adult medical examination without abnormal findings: Secondary | ICD-10-CM | POA: Diagnosis not present

## 2017-08-28 DIAGNOSIS — R569 Unspecified convulsions: Secondary | ICD-10-CM | POA: Diagnosis not present

## 2017-08-28 DIAGNOSIS — R739 Hyperglycemia, unspecified: Secondary | ICD-10-CM | POA: Diagnosis not present

## 2017-08-28 DIAGNOSIS — E034 Atrophy of thyroid (acquired): Secondary | ICD-10-CM | POA: Diagnosis not present

## 2017-08-28 DIAGNOSIS — K219 Gastro-esophageal reflux disease without esophagitis: Secondary | ICD-10-CM | POA: Diagnosis not present

## 2017-08-28 DIAGNOSIS — E7800 Pure hypercholesterolemia, unspecified: Secondary | ICD-10-CM | POA: Diagnosis not present

## 2017-08-28 DIAGNOSIS — D72818 Other decreased white blood cell count: Secondary | ICD-10-CM | POA: Diagnosis not present

## 2017-08-28 DIAGNOSIS — Z9989 Dependence on other enabling machines and devices: Secondary | ICD-10-CM | POA: Diagnosis not present

## 2017-09-03 ENCOUNTER — Encounter
Admission: RE | Admit: 2017-09-03 | Discharge: 2017-09-03 | Disposition: A | Discharge status: Home / Self Care | Payer: PPO | Source: Ambulatory Visit | Attending: Nurse Practitioner | Admitting: Nurse Practitioner

## 2017-09-03 ENCOUNTER — Ambulatory Visit
Admission: RE | Admit: 2017-09-03 | Discharge: 2017-09-03 | Disposition: A | Discharge status: Home / Self Care | Payer: PPO | Source: Ambulatory Visit | Attending: Nurse Practitioner | Admitting: Nurse Practitioner

## 2017-09-03 DIAGNOSIS — R140 Abdominal distension (gaseous): Secondary | ICD-10-CM | POA: Insufficient documentation

## 2017-09-03 DIAGNOSIS — R1011 Right upper quadrant pain: Secondary | ICD-10-CM | POA: Insufficient documentation

## 2017-09-03 MED ORDER — TECHNETIUM TC 99M MEBROFENIN IV KIT
5.3900 | PACK | Freq: Once | INTRAVENOUS | Status: AC | PRN
  Administered 2017-09-03: 5.39 via INTRAVENOUS

## 2017-09-11 DIAGNOSIS — E034 Atrophy of thyroid (acquired): Secondary | ICD-10-CM | POA: Diagnosis not present

## 2017-09-11 DIAGNOSIS — F3342 Major depressive disorder, recurrent, in full remission: Secondary | ICD-10-CM | POA: Diagnosis not present

## 2017-09-11 DIAGNOSIS — E7800 Pure hypercholesterolemia, unspecified: Secondary | ICD-10-CM | POA: Diagnosis not present

## 2017-09-11 DIAGNOSIS — E538 Deficiency of other specified B group vitamins: Secondary | ICD-10-CM | POA: Diagnosis not present

## 2017-09-11 DIAGNOSIS — R739 Hyperglycemia, unspecified: Secondary | ICD-10-CM | POA: Diagnosis not present

## 2017-09-11 DIAGNOSIS — D72818 Other decreased white blood cell count: Secondary | ICD-10-CM | POA: Diagnosis not present

## 2017-09-11 DIAGNOSIS — K219 Gastro-esophageal reflux disease without esophagitis: Secondary | ICD-10-CM | POA: Diagnosis not present

## 2017-09-11 DIAGNOSIS — I700 Atherosclerosis of aorta: Secondary | ICD-10-CM | POA: Diagnosis not present

## 2017-09-11 DIAGNOSIS — G4733 Obstructive sleep apnea (adult) (pediatric): Secondary | ICD-10-CM | POA: Diagnosis not present

## 2017-09-11 DIAGNOSIS — R569 Unspecified convulsions: Secondary | ICD-10-CM | POA: Diagnosis not present

## 2017-09-11 DIAGNOSIS — Z9989 Dependence on other enabling machines and devices: Secondary | ICD-10-CM | POA: Diagnosis not present

## 2017-10-02 DIAGNOSIS — G4733 Obstructive sleep apnea (adult) (pediatric): Secondary | ICD-10-CM | POA: Diagnosis not present

## 2017-10-23 DIAGNOSIS — G4733 Obstructive sleep apnea (adult) (pediatric): Secondary | ICD-10-CM | POA: Diagnosis not present

## 2018-01-21 DIAGNOSIS — H40053 Ocular hypertension, bilateral: Secondary | ICD-10-CM | POA: Diagnosis not present

## 2018-01-27 DIAGNOSIS — H40053 Ocular hypertension, bilateral: Secondary | ICD-10-CM | POA: Diagnosis not present

## 2018-02-04 ENCOUNTER — Emergency Department: Payer: PPO

## 2018-02-04 ENCOUNTER — Emergency Department
Admission: EM | Admit: 2018-02-04 | Discharge: 2018-02-04 | Disposition: A | Discharge status: Home / Self Care | Payer: PPO | Attending: Emergency Medicine | Admitting: Emergency Medicine

## 2018-02-04 ENCOUNTER — Encounter: Payer: Self-pay | Admitting: Emergency Medicine

## 2018-02-04 ENCOUNTER — Other Ambulatory Visit: Payer: Self-pay

## 2018-02-04 DIAGNOSIS — R531 Weakness: Secondary | ICD-10-CM | POA: Diagnosis not present

## 2018-02-04 DIAGNOSIS — Z79899 Other long term (current) drug therapy: Secondary | ICD-10-CM | POA: Diagnosis not present

## 2018-02-04 DIAGNOSIS — R079 Chest pain, unspecified: Secondary | ICD-10-CM | POA: Diagnosis not present

## 2018-02-04 DIAGNOSIS — R410 Disorientation, unspecified: Secondary | ICD-10-CM | POA: Diagnosis present

## 2018-02-04 DIAGNOSIS — E039 Hypothyroidism, unspecified: Secondary | ICD-10-CM | POA: Diagnosis not present

## 2018-02-04 DIAGNOSIS — R4182 Altered mental status, unspecified: Secondary | ICD-10-CM | POA: Diagnosis not present

## 2018-02-04 LAB — URINALYSIS, COMPLETE (UACMP) WITH MICROSCOPIC
Bilirubin Urine: NEGATIVE
Glucose, UA: NEGATIVE mg/dL
HGB URINE DIPSTICK: NEGATIVE
KETONES UR: 5 mg/dL — AB
Leukocytes, UA: NEGATIVE
NITRITE: NEGATIVE
PH: 7 (ref 5.0–8.0)
Protein, ur: NEGATIVE mg/dL
Specific Gravity, Urine: 1.005 (ref 1.005–1.030)

## 2018-02-04 LAB — COMPREHENSIVE METABOLIC PANEL
ALBUMIN: 4.8 g/dL (ref 3.5–5.0)
ALT: 24 U/L (ref 0–44)
AST: 30 U/L (ref 15–41)
Alkaline Phosphatase: 56 U/L (ref 38–126)
Anion gap: 11 (ref 5–15)
BUN: 13 mg/dL (ref 8–23)
CHLORIDE: 105 mmol/L (ref 98–111)
CO2: 23 mmol/L (ref 22–32)
CREATININE: 0.94 mg/dL (ref 0.44–1.00)
Calcium: 9.4 mg/dL (ref 8.9–10.3)
GFR calc Af Amer: >60 mL/min (ref >60)
GFR calc non Af Amer: >60 mL/min (ref >60)
GLUCOSE: 92 mg/dL (ref 70–99)
POTASSIUM: 3.6 mmol/L (ref 3.5–5.1)
Sodium: 139 mmol/L (ref 135–145)
Total Bilirubin: 0.9 mg/dL (ref 0.3–1.2)
Total Protein: 7.5 g/dL (ref 6.5–8.1)

## 2018-02-04 LAB — CBC
HEMATOCRIT: 40 % (ref 35.0–47.0)
Hemoglobin: 13.8 g/dL (ref 12.0–16.0)
MCH: 34.5 pg — ABNORMAL HIGH (ref 26.0–34.0)
MCHC: 34.6 g/dL (ref 32.0–36.0)
MCV: 99.8 fL (ref 80.0–100.0)
Platelets: 211 10*3/uL (ref 150–440)
RBC: 4.01 MIL/uL (ref 3.80–5.20)
RDW: 12.4 % (ref 11.5–14.5)
WBC: 8.3 10*3/uL (ref 3.6–11.0)

## 2018-02-04 LAB — TROPONIN I: Troponin I: <0.03 ng/mL (ref <0.03)

## 2018-02-04 LAB — LIPASE, BLOOD: LIPASE: 46 U/L (ref 11–51)

## 2018-02-04 MED ORDER — LAMOTRIGINE 25 MG PO TABS
50.0000 mg | ORAL_TABLET | Freq: Once | ORAL | Status: AC
  Administered 2018-02-04: 50 mg via ORAL
  Filled 2018-02-04: qty 2

## 2018-02-04 MED ORDER — LAMOTRIGINE 25 MG PO TABS: 25.0000 mg | ORAL_TABLET | Freq: Two times a day (BID) | ORAL | 2 refills | Status: DC

## 2018-02-04 NOTE — ED Triage Notes (Signed)
Pt to ED from [facility] c/o abd pain for a few days, denies urinary symptoms, states seizure years ago and on medications but "feels like she did before the last one".  States disoriented and shaking.  Patient A&Ox4 in triage, speaking in complete and coherent sentences.

## 2018-02-04 NOTE — ED Notes (Signed)
Pt ambulatory to bathroom with standby assist.

## 2018-02-04 NOTE — Discharge Instructions (Addendum)
Please give Dr. Trena Platt office a call in the morning to set up follow-up.  Return here if you feel sicker.  We will increase your Lamictal by 50 mg a day.  To do this we will have you take 1 of the 100 mg pills twice a day and 1 of the 25 mg pills twice a day.

## 2018-02-04 NOTE — ED Provider Notes (Signed)
[facility] Emergency Department Provider Note   ____________________________________________   First MD Initiated Contact with Patient 02/04/18 1739     (approximate)  I have reviewed the triage vital signs and the nursing notes.   HISTORY  Chief Complaint Abdominal Pain    HPI Morgan Good is a 67 y.o. female patient reportedly has been not feeling like herself.  She is been feeling disoriented and feeling like she is being pulled down she says.  Today she was very shaky and seemed disoriented so her husband brought her here.  Her husband reports this is what she acted like before she had her last grand mal seizure which was several years ago.  At that time she was seen at [facility] placed on Lamictal and got better.  She still taking the Lamictal.  Her husband said she had some small seizures associated with the large tonic-clonic seizure.  This is what she acted like when she was having the small seizures.  In reviewing the past records I am unable to review the neurology notes from [place].   Past Medical History:  Diagnosis Date  . Arthritis   . B12 deficiency   . Depression   . Epilepsy ([place])   . GERD (gastroesophageal reflux disease)   . Headache   . Hypercholesterolemia   . Hyperlipidemia   . Hypothyroidism   . Hypothyroidism   . IBS (irritable bowel syndrome)   . Renal stones   . Seizures ([place])   . Shingles   . Sleep apnea   . Stress incontinence   . Visual field loss     There are no active problems to display for this patient.   Past Surgical History:  Procedure Laterality Date  . COLONOSCOPY WITH PROPOFOL N/A 08/22/2015   Procedure: COLONOSCOPY WITH PROPOFOL;  Surgeon: Manya Silvas, MD;  Location: [facility] ENDOSCOPY;  Service: Endoscopy;  Laterality: N/A;  . ESOPHAGOGASTRODUODENOSCOPY (EGD) WITH PROPOFOL N/A 08/22/2015   Procedure: ESOPHAGOGASTRODUODENOSCOPY (EGD) WITH PROPOFOL;  Surgeon: Manya Silvas, MD;  Location: [facility] ENDOSCOPY;   Service: Endoscopy;  Laterality: N/A;    Prior to Admission medications   Medication Sig Start Date End Date Taking? Authorizing Provider  lamoTRIgine (LAMICTAL) 100 MG tablet Take 100 mg by mouth daily.    [provider]  lamoTRIgine (LAMICTAL) 25 MG tablet Take 1 tablet (25 mg total) by mouth 2 (two) times daily. 02/04/18 02/04/19  Nena Polio, MD  levothyroxine (SYNTHROID, LEVOTHROID) 75 MCG tablet Take 75 mcg by mouth daily before breakfast.    [provider]  Linaclotide (LINZESS) 145 MCG CAPS capsule Take 145 mcg by mouth daily.    [provider]  lovastatin (MEVACOR) 40 MG tablet Take 40 mg by mouth at bedtime.    [provider]  pantoprazole (PROTONIX) 40 MG tablet Take 40 mg by mouth daily.    [provider]  Polyethyl Glycol-Propyl Glycol (SYSTANE OP) Place 1-2 drops into both eyes 2 (two) times daily.    [provider]  predniSONE (DELTASONE) 10 MG tablet 40 mg by mouth for 2 days 30 mg by mouth for 2 days 20 mg by mouth for 2 days 10 mg by mouth for 2 days. 07/25/17   Lisa Roca, MD  predniSONE (DELTASONE) 50 MG tablet Take 50 mg by mouth daily with breakfast.    [provider]  traZODone (DESYREL) 100 MG tablet Take 100 mg by mouth at bedtime as needed for sleep.  [provider]  venlafaxine (EFFEXOR) 75 MG tablet Take 150 mg by mouth 2 (two) times daily.     [provider]  vitamin B-12 (CYANOCOBALAMIN) 1000 MCG tablet Take 1,000 mcg by mouth daily.    [provider]    Allergies Contrast media [iodinated diagnostic agents]; Meloxicam; and Sulfa antibiotics  Family History  Problem Relation Age of Onset  . Breast cancer Sister 56    Social History Social History   Tobacco Use  . Smoking status: Never Smoker  . Smokeless tobacco: Never Used  Substance Use Topics  . Alcohol use: Yes    Alcohol/week: 1.2 oz    Types: 2 Glasses of wine per week  . Drug use: Never     Review of Systems  Constitutional: No fever/chills Eyes: No visual changes. ENT: No sore throat. Cardiovascular: Denies chest pain. Respiratory: Denies shortness of breath. Gastrointestinal: No abdominal pain.  No nausea, no vomiting.  No diarrhea.  No constipation. Genitourinary: Negative for dysuria. Musculoskeletal: Negative for back pain. Skin: Negative for rash. Neurological: Negative for headaches, focal weakness  ____________________________________________   PHYSICAL EXAM:  VITAL SIGNS: ED Triage Vitals [02/04/18 1724]  Enc Vitals Group     BP 130/65     Pulse Rate 71     Resp 18     Temp 98.3 F (36.8 C)     Temp Source Oral     SpO2 100 %     Weight 123 lb (55.8 kg)     Height 5' (1.524 m)     Head Circumference      Peak Flow      Pain Score 5     Pain Loc      Pain Edu?      Excl. in [place]?     Constitutional: Alert and oriented. Well appearing and in no acute distress. Eyes: Conjunctivae are normal. PERRL. EOMI. fundi appear normal Head: Atraumatic. Nose: No congestion/rhinnorhea. Mouth/Throat: Mucous membranes are moist.  Oropharynx non-erythematous.  Ears appear normal TMs are clear Neck: No stridor.  Cardiovascular: Normal rate, regular rhythm. Grossly normal heart sounds.  Good peripheral circulation. Respiratory: Normal respiratory effort.  No retractions. Lungs CTAB. Gastrointestinal: Soft and nontender. No distention. No abdominal bruits. No CVA tenderness. Musculoskeletal: No lower extremity tenderness nor edema.  No joint effusions.  Back is tender in the lumbar area over the spine this is apparently old. Neurologic:  Normal speech and language. No gross focal neurologic deficits are appreciated.  Cranial nerves II through XII are intact although visual fields were not checked cerebellar finger-to-nose and rapid alternating movements are slightly slowed bilaterally but basically intact.  There is no weakness that I can elicit and there is no  numbness. Skin:  Skin is warm, dry and intact. No rash noted. Psychiatric: Mood and affect are normal. Speech and behavior are normal.  ____________________________________________   LABS (all labs ordered are listed, but only abnormal results are displayed)  Labs Reviewed  CBC - Abnormal; Notable for the following components:      Result Value   MCH 34.5 (*)    All other components within normal limits  URINALYSIS, COMPLETE (UACMP) WITH MICROSCOPIC - Abnormal; Notable for the following components:   Color, Urine YELLOW (*)    APPearance CLEAR (*)    Ketones, ur 5 (*)    Bacteria, UA RARE (*)    All other components within normal limits  LIPASE, BLOOD  COMPREHENSIVE METABOLIC PANEL  TROPONIN I  LAMOTRIGINE  LEVEL   ____________________________________________  EKG  EKG read and interpreted by me shows normal sinus rhythm rate of 61 left axis no acute ST-T wave changes ____________________________________________  RADIOLOGY  ED MD interpretation:    Official radiology report(s): Dg Chest 2 View  Result Date: 02/04/2018 CLINICAL DATA:  Abdominal pain for a few days, chest pain under ribs when sitting, history of seizure disorder EXAM: CHEST - 2 VIEW COMPARISON:  01/21/2010 FINDINGS: Normal heart size, mediastinal contours, and pulmonary vascularity. Atherosclerotic calcification aorta. Lungs clear. No pulmonary infiltrate, pleural effusion or pneumothorax. Bones demineralized. IMPRESSION: No acute abnormalities. Electronically Signed   By: Lavonia Dana M.D.   On: 02/04/2018 18:20   Ct Head Wo Contrast  Result Date: 02/04/2018 CLINICAL DATA:  Altered mental status with disorientation. History of seizures. EXAM: CT HEAD WITHOUT CONTRAST TECHNIQUE: Contiguous axial images were obtained from the base of the skull through the vertex without intravenous contrast. COMPARISON:  Head CT January 21, 2010 and brain MRI November 14, 2011 FINDINGS: Brain: The ventricles are normal in size and  configuration. There is no intracranial mass, hemorrhage, extra-axial fluid collection, or midline shift. Patchy areas of decreased attenuation are seen throughout the periventricular white matter in the centra semiovale bilaterally. The appearance is consistent with either small vessel vascular disease or demyelination. Both entities may be present concurrently. Lesions of this nature were present on prior MR. No acute appearing infarct is evident on this study. Vascular: No hyperdense vessel evident. There is slight calcification in the left carotid siphon region. Skull: Bony calvarium appears intact. Sinuses/Orbits: Visualized paranasal sinuses are clear. Visualized orbits appear symmetric bilaterally. Other: Mastoid air cells are clear. IMPRESSION: Patchy areas of decreased attenuation in the periventricular white matter. Questions small vessel disease versus demyelination. Both entities may be present concurrently. No acute appearing infarct is demonstrable. No mass or hemorrhage evident. There is slight arterial vascular calcification. Electronically Signed   By: Lowella Grip III M.D.   On: 02/04/2018 18:21    ____________________________________________   PROCEDURES  Procedure(s) performed:   Procedures  Cr ____________________________________________   INITIAL IMPRESSION / ASSESSMENT AND PLAN / ED COURSE  Discussed patient with Dr. Irish Elders neurology.  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  He wants to increase her Lamictal by 50 mg and have her follow-up with outpatient neurology.  Outpatient neurology can decide if they need an MRI.  Patient is comfortable with this.  We will increase her Lamictal by 50 mg and let her go.  I have given her referral for Dr. Manuella Ghazi.  She will return here for any further problems Clinical Course as of Feb 05 2031  Tue Feb 04, 2018  1944 Leukocytes, UA: NEGATIVE [PM]    Clinical Course User Index [PM] Nena Polio, MD     ____________________________________________   FINAL CLINICAL IMPRESSION(S) / ED DIAGNOSES  Final diagnoses:  Weakness     ED Discharge Orders        Ordered    lamoTRIgine (LAMICTAL) 25 MG tablet  2 times daily     02/04/18 1939       Note:  This document was prepared using Dragon voice recognition software and may include unintentional dictation errors.    Nena Polio, MD 02/04/18 2033

## 2018-02-08 LAB — LAMOTRIGINE LEVEL: Lamotrigine Lvl: 3.2 ug/mL (ref 2.0–20.0)

## 2018-02-18 ENCOUNTER — Other Ambulatory Visit: Payer: Self-pay | Admitting: Neurology

## 2018-02-18 DIAGNOSIS — G40219 Localization-related (focal) (partial) symptomatic epilepsy and epileptic syndromes with complex partial seizures, intractable, without status epilepticus: Secondary | ICD-10-CM | POA: Diagnosis not present

## 2018-02-18 DIAGNOSIS — G43011 Migraine without aura, intractable, with status migrainosus: Secondary | ICD-10-CM | POA: Diagnosis not present

## 2018-02-18 DIAGNOSIS — Z79899 Other long term (current) drug therapy: Secondary | ICD-10-CM | POA: Diagnosis not present

## 2018-02-18 DIAGNOSIS — R9082 White matter disease, unspecified: Secondary | ICD-10-CM | POA: Diagnosis not present

## 2018-02-19 IMAGING — NM NM HEPATO W/GB/PHARM/[PERSON_NAME]
2 series · 12 of 12 positions shown · non-contrast
Comparison: 09/03/2017 ultrasound.

CLINICAL DATA: 66-year-old female with abdominal pain right upper
quadrant for 6 weeks. Subsequent encounter.

EXAM:
NUCLEAR MEDICINE HEPATOBILIARY IMAGING WITH GALLBLADDER EF
TECHNIQUE: Sequential images of the abdomen were obtained [DATE] minutes
following intravenous administration of radiopharmaceutical. After
oral ingestion of Ensure, gallbladder ejection fraction was
determined. At 60 min, normal ejection fraction is greater than 33%.
RADIOPHARMACEUTICALS:  5.39 mCi 2c-33m  Choletec IV

[Series 1000: hepatobiliary scan · 9.59mm/px · 6 of 60 frames shown]
[frame 6/60]
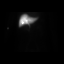
[frame 16/60]
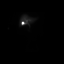
[frame 26/60]
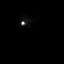
[frame 36/60]
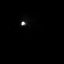
[frame 46/60]
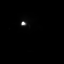
[frame 56/60]
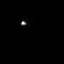

[Series 1000: gallbladder ef · 4.80mm/px · 6 of 120 frames shown]
[frame 11/120]
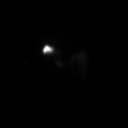
[frame 31/120]
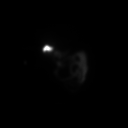
[frame 51/120]
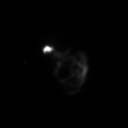
[frame 71/120]
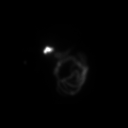
[frame 91/120]
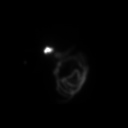
[frame 111/120]
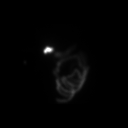

[12 of 12 positions shown; findings below may reference images not displayed]

FINDINGS: Prompt uptake and biliary excretion of activity by the liver is
seen. Gallbladder activity is visualized, consistent with patency of
cystic duct. Biliary activity passes into small bowel, consistent
with patent common bile duct.

Calculated gallbladder ejection fraction is 68%%. (Normal
gallbladder ejection fraction with Ensure is greater than 33%.)
IMPRESSION: Normal gallbladder ejection fraction.

## 2018-02-27 ENCOUNTER — Other Ambulatory Visit: Payer: Self-pay | Admitting: Neurology

## 2018-02-27 ENCOUNTER — Ambulatory Visit
Admission: RE | Admit: 2018-02-27 | Discharge: 2018-02-27 | Disposition: A | Discharge status: Home / Self Care | Payer: PPO | Source: Ambulatory Visit | Attending: Neurology | Admitting: Neurology

## 2018-02-27 DIAGNOSIS — G40219 Localization-related (focal) (partial) symptomatic epilepsy and epileptic syndromes with complex partial seizures, intractable, without status epilepticus: Secondary | ICD-10-CM

## 2018-02-27 DIAGNOSIS — R4182 Altered mental status, unspecified: Secondary | ICD-10-CM | POA: Diagnosis not present

## 2018-02-27 DIAGNOSIS — R569 Unspecified convulsions: Secondary | ICD-10-CM | POA: Diagnosis not present

## 2018-02-27 MED ORDER — GADOBENATE DIMEGLUMINE 529 MG/ML IV SOLN: 10.0000 mL | Freq: Once | INTRAVENOUS | Status: DC | PRN

## 2018-03-22 DIAGNOSIS — G40219 Localization-related (focal) (partial) symptomatic epilepsy and epileptic syndromes with complex partial seizures, intractable, without status epilepticus: Secondary | ICD-10-CM | POA: Diagnosis not present

## 2018-03-25 DIAGNOSIS — F3342 Major depressive disorder, recurrent, in full remission: Secondary | ICD-10-CM | POA: Diagnosis not present

## 2018-03-25 DIAGNOSIS — R569 Unspecified convulsions: Secondary | ICD-10-CM | POA: Diagnosis not present

## 2018-03-25 DIAGNOSIS — E7800 Pure hypercholesterolemia, unspecified: Secondary | ICD-10-CM | POA: Diagnosis not present

## 2018-03-25 DIAGNOSIS — I700 Atherosclerosis of aorta: Secondary | ICD-10-CM | POA: Diagnosis not present

## 2018-03-25 DIAGNOSIS — G4733 Obstructive sleep apnea (adult) (pediatric): Secondary | ICD-10-CM | POA: Diagnosis not present

## 2018-03-25 DIAGNOSIS — D72818 Other decreased white blood cell count: Secondary | ICD-10-CM | POA: Diagnosis not present

## 2018-03-25 DIAGNOSIS — R739 Hyperglycemia, unspecified: Secondary | ICD-10-CM | POA: Diagnosis not present

## 2018-03-25 DIAGNOSIS — E538 Deficiency of other specified B group vitamins: Secondary | ICD-10-CM | POA: Diagnosis not present

## 2018-03-25 DIAGNOSIS — Z9989 Dependence on other enabling machines and devices: Secondary | ICD-10-CM | POA: Diagnosis not present

## 2018-03-25 DIAGNOSIS — G40219 Localization-related (focal) (partial) symptomatic epilepsy and epileptic syndromes with complex partial seizures, intractable, without status epilepticus: Secondary | ICD-10-CM | POA: Diagnosis not present

## 2018-03-25 DIAGNOSIS — E034 Atrophy of thyroid (acquired): Secondary | ICD-10-CM | POA: Diagnosis not present

## 2018-03-25 DIAGNOSIS — K219 Gastro-esophageal reflux disease without esophagitis: Secondary | ICD-10-CM | POA: Diagnosis not present

## 2018-04-22 ENCOUNTER — Other Ambulatory Visit: Payer: Self-pay | Admitting: Internal Medicine

## 2018-04-22 DIAGNOSIS — G40219 Localization-related (focal) (partial) symptomatic epilepsy and epileptic syndromes with complex partial seizures, intractable, without status epilepticus: Secondary | ICD-10-CM | POA: Diagnosis not present

## 2018-04-22 DIAGNOSIS — Z1231 Encounter for screening mammogram for malignant neoplasm of breast: Secondary | ICD-10-CM

## 2018-04-22 DIAGNOSIS — F3342 Major depressive disorder, recurrent, in full remission: Secondary | ICD-10-CM | POA: Diagnosis not present

## 2018-04-22 DIAGNOSIS — R569 Unspecified convulsions: Secondary | ICD-10-CM | POA: Diagnosis not present

## 2018-04-22 DIAGNOSIS — E034 Atrophy of thyroid (acquired): Secondary | ICD-10-CM | POA: Diagnosis not present

## 2018-04-22 DIAGNOSIS — E7800 Pure hypercholesterolemia, unspecified: Secondary | ICD-10-CM | POA: Diagnosis not present

## 2018-04-22 DIAGNOSIS — G4733 Obstructive sleep apnea (adult) (pediatric): Secondary | ICD-10-CM | POA: Diagnosis not present

## 2018-04-22 DIAGNOSIS — I700 Atherosclerosis of aorta: Secondary | ICD-10-CM | POA: Diagnosis not present

## 2018-04-22 DIAGNOSIS — R739 Hyperglycemia, unspecified: Secondary | ICD-10-CM | POA: Diagnosis not present

## 2018-04-22 DIAGNOSIS — Z9989 Dependence on other enabling machines and devices: Secondary | ICD-10-CM | POA: Diagnosis not present

## 2018-04-22 DIAGNOSIS — E538 Deficiency of other specified B group vitamins: Secondary | ICD-10-CM | POA: Diagnosis not present

## 2018-04-22 DIAGNOSIS — K219 Gastro-esophageal reflux disease without esophagitis: Secondary | ICD-10-CM | POA: Diagnosis not present

## 2018-04-22 DIAGNOSIS — D72818 Other decreased white blood cell count: Secondary | ICD-10-CM | POA: Diagnosis not present

## 2018-05-05 DIAGNOSIS — H109 Unspecified conjunctivitis: Secondary | ICD-10-CM | POA: Diagnosis not present

## 2018-05-13 ENCOUNTER — Ambulatory Visit
Admission: RE | Admit: 2018-05-13 | Discharge: 2018-05-13 | Disposition: A | Discharge status: Home / Self Care | Payer: PPO | Source: Ambulatory Visit | Attending: Internal Medicine | Admitting: Internal Medicine

## 2018-05-13 ENCOUNTER — Encounter (INDEPENDENT_AMBULATORY_CARE_PROVIDER_SITE_OTHER): Payer: Self-pay

## 2018-05-13 DIAGNOSIS — Z1231 Encounter for screening mammogram for malignant neoplasm of breast: Secondary | ICD-10-CM | POA: Insufficient documentation

## 2018-05-15 DIAGNOSIS — K581 Irritable bowel syndrome with constipation: Secondary | ICD-10-CM | POA: Diagnosis not present

## 2018-05-15 DIAGNOSIS — K219 Gastro-esophageal reflux disease without esophagitis: Secondary | ICD-10-CM | POA: Diagnosis not present

## 2018-05-26 DIAGNOSIS — M19042 Primary osteoarthritis, left hand: Secondary | ICD-10-CM | POA: Diagnosis not present

## 2018-05-26 DIAGNOSIS — M19041 Primary osteoarthritis, right hand: Secondary | ICD-10-CM | POA: Diagnosis not present

## 2018-05-27 DIAGNOSIS — L578 Other skin changes due to chronic exposure to nonionizing radiation: Secondary | ICD-10-CM | POA: Diagnosis not present

## 2018-05-27 DIAGNOSIS — Z86018 Personal history of other benign neoplasm: Secondary | ICD-10-CM | POA: Diagnosis not present

## 2018-05-27 DIAGNOSIS — Z872 Personal history of diseases of the skin and subcutaneous tissue: Secondary | ICD-10-CM | POA: Diagnosis not present

## 2018-05-27 DIAGNOSIS — L821 Other seborrheic keratosis: Secondary | ICD-10-CM | POA: Diagnosis not present

## 2018-05-27 DIAGNOSIS — Z1283 Encounter for screening for malignant neoplasm of skin: Secondary | ICD-10-CM | POA: Diagnosis not present

## 2018-06-17 DIAGNOSIS — K581 Irritable bowel syndrome with constipation: Secondary | ICD-10-CM | POA: Diagnosis not present

## 2018-06-17 DIAGNOSIS — K219 Gastro-esophageal reflux disease without esophagitis: Secondary | ICD-10-CM | POA: Diagnosis not present

## 2018-06-17 DIAGNOSIS — K760 Fatty (change of) liver, not elsewhere classified: Secondary | ICD-10-CM | POA: Diagnosis not present

## 2018-07-15 DIAGNOSIS — R102 Pelvic and perineal pain: Secondary | ICD-10-CM | POA: Diagnosis not present

## 2018-07-15 DIAGNOSIS — N941 Unspecified dyspareunia: Secondary | ICD-10-CM | POA: Diagnosis not present

## 2018-07-21 DIAGNOSIS — Z9181 History of falling: Secondary | ICD-10-CM | POA: Diagnosis not present

## 2018-07-21 DIAGNOSIS — K760 Fatty (change of) liver, not elsewhere classified: Secondary | ICD-10-CM | POA: Diagnosis not present

## 2018-07-21 DIAGNOSIS — Z79899 Other long term (current) drug therapy: Secondary | ICD-10-CM | POA: Diagnosis not present

## 2018-07-21 DIAGNOSIS — R569 Unspecified convulsions: Secondary | ICD-10-CM | POA: Diagnosis not present

## 2018-07-21 DIAGNOSIS — R102 Pelvic and perineal pain: Secondary | ICD-10-CM | POA: Diagnosis not present

## 2018-07-22 DIAGNOSIS — H01113 Allergic dermatitis of right eye, unspecified eyelid: Secondary | ICD-10-CM | POA: Diagnosis not present

## 2018-08-05 DIAGNOSIS — F3342 Major depressive disorder, recurrent, in full remission: Secondary | ICD-10-CM | POA: Diagnosis not present

## 2018-08-05 DIAGNOSIS — Z7189 Other specified counseling: Secondary | ICD-10-CM | POA: Diagnosis not present

## 2018-09-05 DIAGNOSIS — G40219 Localization-related (focal) (partial) symptomatic epilepsy and epileptic syndromes with complex partial seizures, intractable, without status epilepticus: Secondary | ICD-10-CM | POA: Diagnosis not present

## 2018-09-05 DIAGNOSIS — G43011 Migraine without aura, intractable, with status migrainosus: Secondary | ICD-10-CM | POA: Diagnosis not present

## 2018-09-05 DIAGNOSIS — Z8669 Personal history of other diseases of the nervous system and sense organs: Secondary | ICD-10-CM | POA: Diagnosis not present

## 2018-10-09 DIAGNOSIS — F633 Trichotillomania: Secondary | ICD-10-CM | POA: Diagnosis not present

## 2018-10-09 DIAGNOSIS — L650 Telogen effluvium: Secondary | ICD-10-CM | POA: Diagnosis not present

## 2018-10-14 DIAGNOSIS — R739 Hyperglycemia, unspecified: Secondary | ICD-10-CM | POA: Diagnosis not present

## 2018-10-14 DIAGNOSIS — E034 Atrophy of thyroid (acquired): Secondary | ICD-10-CM | POA: Diagnosis not present

## 2018-10-14 DIAGNOSIS — E538 Deficiency of other specified B group vitamins: Secondary | ICD-10-CM | POA: Diagnosis not present

## 2018-10-14 DIAGNOSIS — E7800 Pure hypercholesterolemia, unspecified: Secondary | ICD-10-CM | POA: Diagnosis not present

## 2018-10-21 DIAGNOSIS — D72818 Other decreased white blood cell count: Secondary | ICD-10-CM | POA: Diagnosis not present

## 2018-10-21 DIAGNOSIS — R739 Hyperglycemia, unspecified: Secondary | ICD-10-CM | POA: Diagnosis not present

## 2018-10-21 DIAGNOSIS — K219 Gastro-esophageal reflux disease without esophagitis: Secondary | ICD-10-CM | POA: Diagnosis not present

## 2018-10-21 DIAGNOSIS — E034 Atrophy of thyroid (acquired): Secondary | ICD-10-CM | POA: Diagnosis not present

## 2018-10-21 DIAGNOSIS — R569 Unspecified convulsions: Secondary | ICD-10-CM | POA: Diagnosis not present

## 2018-10-21 DIAGNOSIS — K760 Fatty (change of) liver, not elsewhere classified: Secondary | ICD-10-CM | POA: Diagnosis not present

## 2018-10-21 DIAGNOSIS — R102 Pelvic and perineal pain: Secondary | ICD-10-CM | POA: Diagnosis not present

## 2018-10-21 DIAGNOSIS — G40219 Localization-related (focal) (partial) symptomatic epilepsy and epileptic syndromes with complex partial seizures, intractable, without status epilepticus: Secondary | ICD-10-CM | POA: Diagnosis not present

## 2018-10-21 DIAGNOSIS — F3342 Major depressive disorder, recurrent, in full remission: Secondary | ICD-10-CM | POA: Diagnosis not present

## 2018-10-21 DIAGNOSIS — M791 Myalgia, unspecified site: Secondary | ICD-10-CM | POA: Diagnosis not present

## 2018-10-21 DIAGNOSIS — I700 Atherosclerosis of aorta: Secondary | ICD-10-CM | POA: Diagnosis not present

## 2018-10-21 DIAGNOSIS — E538 Deficiency of other specified B group vitamins: Secondary | ICD-10-CM | POA: Diagnosis not present

## 2018-10-21 DIAGNOSIS — G4733 Obstructive sleep apnea (adult) (pediatric): Secondary | ICD-10-CM | POA: Diagnosis not present

## 2018-10-21 DIAGNOSIS — E7800 Pure hypercholesterolemia, unspecified: Secondary | ICD-10-CM | POA: Diagnosis not present

## 2018-11-06 DIAGNOSIS — H04123 Dry eye syndrome of bilateral lacrimal glands: Secondary | ICD-10-CM | POA: Diagnosis not present

## 2018-11-06 DIAGNOSIS — H02825 Cysts of left lower eyelid: Secondary | ICD-10-CM | POA: Diagnosis not present

## 2018-11-06 DIAGNOSIS — R41840 Attention and concentration deficit: Secondary | ICD-10-CM | POA: Diagnosis not present

## 2018-11-06 DIAGNOSIS — H43811 Vitreous degeneration, right eye: Secondary | ICD-10-CM | POA: Diagnosis not present

## 2018-11-06 DIAGNOSIS — F5105 Insomnia due to other mental disorder: Secondary | ICD-10-CM | POA: Diagnosis not present

## 2018-11-06 DIAGNOSIS — F411 Generalized anxiety disorder: Secondary | ICD-10-CM | POA: Diagnosis not present

## 2018-11-06 DIAGNOSIS — H02822 Cysts of right lower eyelid: Secondary | ICD-10-CM | POA: Diagnosis not present

## 2018-11-06 DIAGNOSIS — H5442A3 Blindness left eye category 3, normal vision right eye: Secondary | ICD-10-CM | POA: Diagnosis not present

## 2018-11-06 DIAGNOSIS — F332 Major depressive disorder, recurrent severe without psychotic features: Secondary | ICD-10-CM | POA: Diagnosis not present

## 2018-11-20 DIAGNOSIS — R41840 Attention and concentration deficit: Secondary | ICD-10-CM | POA: Diagnosis not present

## 2018-11-20 DIAGNOSIS — F411 Generalized anxiety disorder: Secondary | ICD-10-CM | POA: Diagnosis not present

## 2018-11-20 DIAGNOSIS — F5105 Insomnia due to other mental disorder: Secondary | ICD-10-CM | POA: Diagnosis not present

## 2018-11-20 DIAGNOSIS — F332 Major depressive disorder, recurrent severe without psychotic features: Secondary | ICD-10-CM | POA: Diagnosis not present

## 2018-11-25 DIAGNOSIS — G40219 Localization-related (focal) (partial) symptomatic epilepsy and epileptic syndromes with complex partial seizures, intractable, without status epilepticus: Secondary | ICD-10-CM | POA: Diagnosis not present

## 2018-11-25 DIAGNOSIS — E7800 Pure hypercholesterolemia, unspecified: Secondary | ICD-10-CM | POA: Diagnosis not present

## 2018-11-25 DIAGNOSIS — K219 Gastro-esophageal reflux disease without esophagitis: Secondary | ICD-10-CM | POA: Diagnosis not present

## 2018-11-25 DIAGNOSIS — I700 Atherosclerosis of aorta: Secondary | ICD-10-CM | POA: Diagnosis not present

## 2018-11-25 DIAGNOSIS — E034 Atrophy of thyroid (acquired): Secondary | ICD-10-CM | POA: Diagnosis not present

## 2018-11-25 DIAGNOSIS — K760 Fatty (change of) liver, not elsewhere classified: Secondary | ICD-10-CM | POA: Diagnosis not present

## 2018-11-25 DIAGNOSIS — Z9989 Dependence on other enabling machines and devices: Secondary | ICD-10-CM | POA: Diagnosis not present

## 2018-11-25 DIAGNOSIS — D72818 Other decreased white blood cell count: Secondary | ICD-10-CM | POA: Diagnosis not present

## 2018-11-25 DIAGNOSIS — F3342 Major depressive disorder, recurrent, in full remission: Secondary | ICD-10-CM | POA: Diagnosis not present

## 2018-11-25 DIAGNOSIS — R739 Hyperglycemia, unspecified: Secondary | ICD-10-CM | POA: Diagnosis not present

## 2018-11-25 DIAGNOSIS — G4733 Obstructive sleep apnea (adult) (pediatric): Secondary | ICD-10-CM | POA: Diagnosis not present

## 2018-11-25 DIAGNOSIS — E538 Deficiency of other specified B group vitamins: Secondary | ICD-10-CM | POA: Diagnosis not present

## 2018-11-28 DIAGNOSIS — R41840 Attention and concentration deficit: Secondary | ICD-10-CM | POA: Diagnosis not present

## 2018-11-28 DIAGNOSIS — F332 Major depressive disorder, recurrent severe without psychotic features: Secondary | ICD-10-CM | POA: Diagnosis not present

## 2018-11-28 DIAGNOSIS — F411 Generalized anxiety disorder: Secondary | ICD-10-CM | POA: Diagnosis not present

## 2018-12-11 DIAGNOSIS — F633 Trichotillomania: Secondary | ICD-10-CM | POA: Diagnosis not present

## 2018-12-19 DIAGNOSIS — F411 Generalized anxiety disorder: Secondary | ICD-10-CM | POA: Diagnosis not present

## 2018-12-19 DIAGNOSIS — F5105 Insomnia due to other mental disorder: Secondary | ICD-10-CM | POA: Diagnosis not present

## 2018-12-19 DIAGNOSIS — R41840 Attention and concentration deficit: Secondary | ICD-10-CM | POA: Diagnosis not present

## 2018-12-19 DIAGNOSIS — F332 Major depressive disorder, recurrent severe without psychotic features: Secondary | ICD-10-CM | POA: Diagnosis not present

## 2019-03-05 DIAGNOSIS — R41840 Attention and concentration deficit: Secondary | ICD-10-CM | POA: Diagnosis not present

## 2019-03-05 DIAGNOSIS — F332 Major depressive disorder, recurrent severe without psychotic features: Secondary | ICD-10-CM | POA: Diagnosis not present

## 2019-03-05 DIAGNOSIS — F411 Generalized anxiety disorder: Secondary | ICD-10-CM | POA: Diagnosis not present

## 2019-03-05 DIAGNOSIS — F5105 Insomnia due to other mental disorder: Secondary | ICD-10-CM | POA: Diagnosis not present

## 2019-03-11 DIAGNOSIS — G40219 Localization-related (focal) (partial) symptomatic epilepsy and epileptic syndromes with complex partial seizures, intractable, without status epilepticus: Secondary | ICD-10-CM | POA: Diagnosis not present

## 2019-03-11 DIAGNOSIS — R4189 Other symptoms and signs involving cognitive functions and awareness: Secondary | ICD-10-CM | POA: Diagnosis not present

## 2019-04-09 ENCOUNTER — Other Ambulatory Visit: Payer: Self-pay | Admitting: Internal Medicine

## 2019-04-09 DIAGNOSIS — Z1231 Encounter for screening mammogram for malignant neoplasm of breast: Secondary | ICD-10-CM

## 2019-04-14 DIAGNOSIS — G43011 Migraine without aura, intractable, with status migrainosus: Secondary | ICD-10-CM | POA: Diagnosis not present

## 2019-05-06 DIAGNOSIS — H40053 Ocular hypertension, bilateral: Secondary | ICD-10-CM | POA: Diagnosis not present

## 2019-05-18 ENCOUNTER — Encounter (INDEPENDENT_AMBULATORY_CARE_PROVIDER_SITE_OTHER): Payer: Self-pay

## 2019-05-18 ENCOUNTER — Ambulatory Visit
Admission: RE | Admit: 2019-05-18 | Discharge: 2019-05-18 | Disposition: A | Discharge status: Home / Self Care | Payer: PPO | Source: Ambulatory Visit | Attending: Internal Medicine | Admitting: Internal Medicine

## 2019-05-18 ENCOUNTER — Other Ambulatory Visit: Payer: Self-pay

## 2019-05-18 DIAGNOSIS — Z1231 Encounter for screening mammogram for malignant neoplasm of breast: Secondary | ICD-10-CM | POA: Diagnosis not present

## 2019-05-21 DIAGNOSIS — R739 Hyperglycemia, unspecified: Secondary | ICD-10-CM | POA: Diagnosis not present

## 2019-05-21 DIAGNOSIS — E034 Atrophy of thyroid (acquired): Secondary | ICD-10-CM | POA: Diagnosis not present

## 2019-05-21 DIAGNOSIS — E7800 Pure hypercholesterolemia, unspecified: Secondary | ICD-10-CM | POA: Diagnosis not present

## 2019-05-21 DIAGNOSIS — E538 Deficiency of other specified B group vitamins: Secondary | ICD-10-CM | POA: Diagnosis not present

## 2019-05-27 DIAGNOSIS — F411 Generalized anxiety disorder: Secondary | ICD-10-CM | POA: Diagnosis not present

## 2019-05-27 DIAGNOSIS — F5105 Insomnia due to other mental disorder: Secondary | ICD-10-CM | POA: Diagnosis not present

## 2019-05-27 DIAGNOSIS — F332 Major depressive disorder, recurrent severe without psychotic features: Secondary | ICD-10-CM | POA: Diagnosis not present

## 2019-05-27 DIAGNOSIS — R41840 Attention and concentration deficit: Secondary | ICD-10-CM | POA: Diagnosis not present

## 2019-05-28 DIAGNOSIS — F3342 Major depressive disorder, recurrent, in full remission: Secondary | ICD-10-CM | POA: Diagnosis not present

## 2019-05-28 DIAGNOSIS — E034 Atrophy of thyroid (acquired): Secondary | ICD-10-CM | POA: Diagnosis not present

## 2019-05-28 DIAGNOSIS — E7800 Pure hypercholesterolemia, unspecified: Secondary | ICD-10-CM | POA: Diagnosis not present

## 2019-05-28 DIAGNOSIS — Z0000 Encounter for general adult medical examination without abnormal findings: Secondary | ICD-10-CM | POA: Diagnosis not present

## 2019-05-28 DIAGNOSIS — R739 Hyperglycemia, unspecified: Secondary | ICD-10-CM | POA: Diagnosis not present

## 2019-05-28 DIAGNOSIS — E538 Deficiency of other specified B group vitamins: Secondary | ICD-10-CM | POA: Diagnosis not present

## 2019-05-28 DIAGNOSIS — D72818 Other decreased white blood cell count: Secondary | ICD-10-CM | POA: Diagnosis not present

## 2019-05-28 DIAGNOSIS — K219 Gastro-esophageal reflux disease without esophagitis: Secondary | ICD-10-CM | POA: Diagnosis not present

## 2019-05-28 DIAGNOSIS — G4733 Obstructive sleep apnea (adult) (pediatric): Secondary | ICD-10-CM | POA: Diagnosis not present

## 2019-05-28 DIAGNOSIS — I700 Atherosclerosis of aorta: Secondary | ICD-10-CM | POA: Diagnosis not present

## 2019-05-28 DIAGNOSIS — Z9989 Dependence on other enabling machines and devices: Secondary | ICD-10-CM | POA: Diagnosis not present

## 2019-05-28 DIAGNOSIS — G40219 Localization-related (focal) (partial) symptomatic epilepsy and epileptic syndromes with complex partial seizures, intractable, without status epilepticus: Secondary | ICD-10-CM | POA: Diagnosis not present

## 2019-06-02 DIAGNOSIS — F633 Trichotillomania: Secondary | ICD-10-CM | POA: Diagnosis not present

## 2019-06-02 DIAGNOSIS — Z86018 Personal history of other benign neoplasm: Secondary | ICD-10-CM | POA: Diagnosis not present

## 2019-06-02 DIAGNOSIS — Z872 Personal history of diseases of the skin and subcutaneous tissue: Secondary | ICD-10-CM | POA: Diagnosis not present

## 2019-06-02 DIAGNOSIS — L578 Other skin changes due to chronic exposure to nonionizing radiation: Secondary | ICD-10-CM | POA: Diagnosis not present

## 2019-06-02 DIAGNOSIS — L821 Other seborrheic keratosis: Secondary | ICD-10-CM | POA: Diagnosis not present

## 2019-06-10 DIAGNOSIS — F411 Generalized anxiety disorder: Secondary | ICD-10-CM | POA: Diagnosis not present

## 2019-06-10 DIAGNOSIS — F5105 Insomnia due to other mental disorder: Secondary | ICD-10-CM | POA: Diagnosis not present

## 2019-06-10 DIAGNOSIS — R41840 Attention and concentration deficit: Secondary | ICD-10-CM | POA: Diagnosis not present

## 2019-06-10 DIAGNOSIS — F332 Major depressive disorder, recurrent severe without psychotic features: Secondary | ICD-10-CM | POA: Diagnosis not present

## 2019-06-18 DIAGNOSIS — K581 Irritable bowel syndrome with constipation: Secondary | ICD-10-CM | POA: Diagnosis not present

## 2019-06-18 DIAGNOSIS — K2100 Gastro-esophageal reflux disease with esophagitis, without bleeding: Secondary | ICD-10-CM | POA: Diagnosis not present

## 2019-06-23 DIAGNOSIS — G43011 Migraine without aura, intractable, with status migrainosus: Secondary | ICD-10-CM | POA: Diagnosis not present

## 2019-07-06 DIAGNOSIS — F5105 Insomnia due to other mental disorder: Secondary | ICD-10-CM | POA: Diagnosis not present

## 2019-07-06 DIAGNOSIS — F332 Major depressive disorder, recurrent severe without psychotic features: Secondary | ICD-10-CM | POA: Diagnosis not present

## 2019-07-06 DIAGNOSIS — F411 Generalized anxiety disorder: Secondary | ICD-10-CM | POA: Diagnosis not present

## 2019-07-06 DIAGNOSIS — R41840 Attention and concentration deficit: Secondary | ICD-10-CM | POA: Diagnosis not present

## 2019-09-03 ENCOUNTER — Other Ambulatory Visit: Payer: Self-pay | Admitting: Gastroenterology

## 2019-09-03 DIAGNOSIS — R1033 Periumbilical pain: Secondary | ICD-10-CM

## 2019-09-03 DIAGNOSIS — K581 Irritable bowel syndrome with constipation: Secondary | ICD-10-CM | POA: Diagnosis not present

## 2019-09-03 DIAGNOSIS — K2100 Gastro-esophageal reflux disease with esophagitis, without bleeding: Secondary | ICD-10-CM | POA: Diagnosis not present

## 2019-09-08 DIAGNOSIS — F332 Major depressive disorder, recurrent severe without psychotic features: Secondary | ICD-10-CM | POA: Diagnosis not present

## 2019-09-08 DIAGNOSIS — R41840 Attention and concentration deficit: Secondary | ICD-10-CM | POA: Diagnosis not present

## 2019-09-08 DIAGNOSIS — F411 Generalized anxiety disorder: Secondary | ICD-10-CM | POA: Diagnosis not present

## 2019-09-08 DIAGNOSIS — F5105 Insomnia due to other mental disorder: Secondary | ICD-10-CM | POA: Diagnosis not present

## 2019-09-15 ENCOUNTER — Ambulatory Visit
Admission: RE | Admit: 2019-09-15 | Discharge: 2019-09-15 | Disposition: A | Discharge status: Home / Self Care | Payer: PPO | Source: Ambulatory Visit | Attending: Gastroenterology | Admitting: Gastroenterology

## 2019-09-15 ENCOUNTER — Other Ambulatory Visit: Payer: Self-pay

## 2019-09-15 DIAGNOSIS — R1033 Periumbilical pain: Secondary | ICD-10-CM | POA: Diagnosis not present

## 2019-09-15 DIAGNOSIS — D259 Leiomyoma of uterus, unspecified: Secondary | ICD-10-CM | POA: Diagnosis not present

## 2019-09-15 DIAGNOSIS — R109 Unspecified abdominal pain: Secondary | ICD-10-CM | POA: Diagnosis not present

## 2019-10-26 DIAGNOSIS — M25511 Pain in right shoulder: Secondary | ICD-10-CM | POA: Diagnosis not present

## 2019-11-03 DIAGNOSIS — H40053 Ocular hypertension, bilateral: Secondary | ICD-10-CM | POA: Diagnosis not present

## 2019-11-09 DIAGNOSIS — H40053 Ocular hypertension, bilateral: Secondary | ICD-10-CM | POA: Diagnosis not present

## 2019-12-03 DIAGNOSIS — E538 Deficiency of other specified B group vitamins: Secondary | ICD-10-CM | POA: Diagnosis not present

## 2019-12-03 DIAGNOSIS — E7800 Pure hypercholesterolemia, unspecified: Secondary | ICD-10-CM | POA: Diagnosis not present

## 2019-12-03 DIAGNOSIS — R739 Hyperglycemia, unspecified: Secondary | ICD-10-CM | POA: Diagnosis not present

## 2019-12-07 DIAGNOSIS — R4189 Other symptoms and signs involving cognitive functions and awareness: Secondary | ICD-10-CM | POA: Diagnosis not present

## 2019-12-07 DIAGNOSIS — R9082 White matter disease, unspecified: Secondary | ICD-10-CM | POA: Diagnosis not present

## 2019-12-07 DIAGNOSIS — G43011 Migraine without aura, intractable, with status migrainosus: Secondary | ICD-10-CM | POA: Diagnosis not present

## 2019-12-07 DIAGNOSIS — Z79899 Other long term (current) drug therapy: Secondary | ICD-10-CM | POA: Diagnosis not present

## 2019-12-07 DIAGNOSIS — G40219 Localization-related (focal) (partial) symptomatic epilepsy and epileptic syndromes with complex partial seizures, intractable, without status epilepticus: Secondary | ICD-10-CM | POA: Diagnosis not present

## 2019-12-10 DIAGNOSIS — G4733 Obstructive sleep apnea (adult) (pediatric): Secondary | ICD-10-CM | POA: Diagnosis not present

## 2019-12-10 DIAGNOSIS — G40219 Localization-related (focal) (partial) symptomatic epilepsy and epileptic syndromes with complex partial seizures, intractable, without status epilepticus: Secondary | ICD-10-CM | POA: Diagnosis not present

## 2019-12-10 DIAGNOSIS — D72818 Other decreased white blood cell count: Secondary | ICD-10-CM | POA: Diagnosis not present

## 2019-12-10 DIAGNOSIS — I700 Atherosclerosis of aorta: Secondary | ICD-10-CM | POA: Diagnosis not present

## 2019-12-10 DIAGNOSIS — Z9989 Dependence on other enabling machines and devices: Secondary | ICD-10-CM | POA: Diagnosis not present

## 2019-12-10 DIAGNOSIS — K219 Gastro-esophageal reflux disease without esophagitis: Secondary | ICD-10-CM | POA: Diagnosis not present

## 2019-12-10 DIAGNOSIS — R739 Hyperglycemia, unspecified: Secondary | ICD-10-CM | POA: Diagnosis not present

## 2019-12-10 DIAGNOSIS — E7800 Pure hypercholesterolemia, unspecified: Secondary | ICD-10-CM | POA: Diagnosis not present

## 2019-12-10 DIAGNOSIS — E538 Deficiency of other specified B group vitamins: Secondary | ICD-10-CM | POA: Diagnosis not present

## 2019-12-10 DIAGNOSIS — E034 Atrophy of thyroid (acquired): Secondary | ICD-10-CM | POA: Diagnosis not present

## 2019-12-10 DIAGNOSIS — Z0000 Encounter for general adult medical examination without abnormal findings: Secondary | ICD-10-CM | POA: Diagnosis not present

## 2019-12-10 DIAGNOSIS — F3342 Major depressive disorder, recurrent, in full remission: Secondary | ICD-10-CM | POA: Diagnosis not present

## 2019-12-14 DIAGNOSIS — R519 Headache, unspecified: Secondary | ICD-10-CM | POA: Diagnosis not present

## 2019-12-14 DIAGNOSIS — M26623 Arthralgia of bilateral temporomandibular joint: Secondary | ICD-10-CM | POA: Diagnosis not present

## 2019-12-14 DIAGNOSIS — J301 Allergic rhinitis due to pollen: Secondary | ICD-10-CM | POA: Diagnosis not present

## 2019-12-14 DIAGNOSIS — H9201 Otalgia, right ear: Secondary | ICD-10-CM | POA: Diagnosis not present

## 2019-12-14 DIAGNOSIS — J342 Deviated nasal septum: Secondary | ICD-10-CM | POA: Diagnosis not present

## 2019-12-22 DIAGNOSIS — F5105 Insomnia due to other mental disorder: Secondary | ICD-10-CM | POA: Diagnosis not present

## 2019-12-22 DIAGNOSIS — R41840 Attention and concentration deficit: Secondary | ICD-10-CM | POA: Diagnosis not present

## 2019-12-22 DIAGNOSIS — F332 Major depressive disorder, recurrent severe without psychotic features: Secondary | ICD-10-CM | POA: Diagnosis not present

## 2019-12-22 DIAGNOSIS — F411 Generalized anxiety disorder: Secondary | ICD-10-CM | POA: Diagnosis not present

## 2019-12-23 IMAGING — US US ABDOMEN COMPLETE
1 series · 13 of 25 positions shown · non-contrast
Comparison: CT abdomen pelvis of 07/25/2017

CLINICAL DATA: Abdominal pain particularly in the right upper
quadrant, history of irritable bowel syndrome

EXAM:
ABDOMEN ULTRASOUND COMPLETE

[Series 1: us abdomen complete · 0.17mm/px · 13 of 116 slices shown]
[im 1/116]
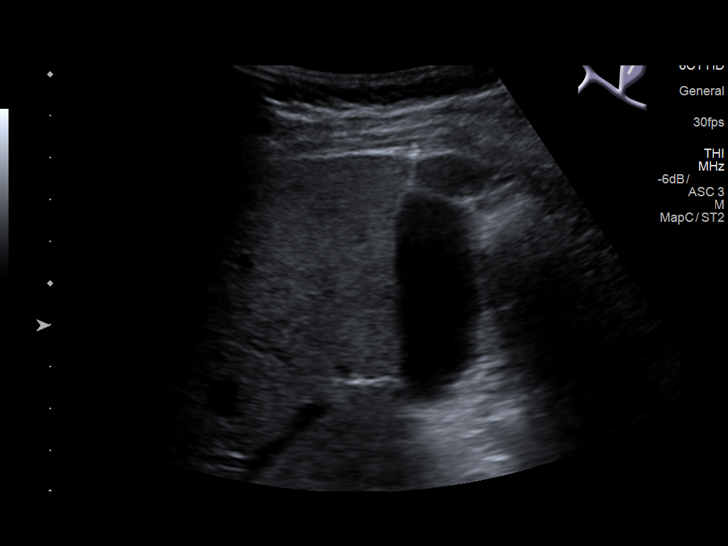
[im 10/116]
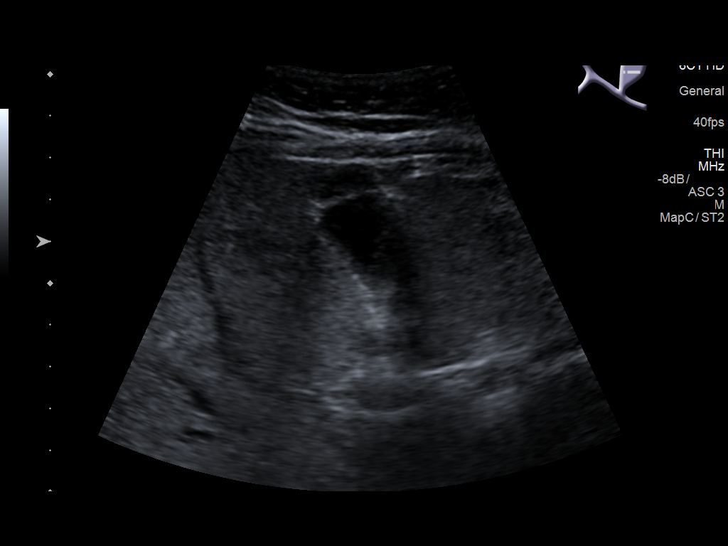
[im 20/116]
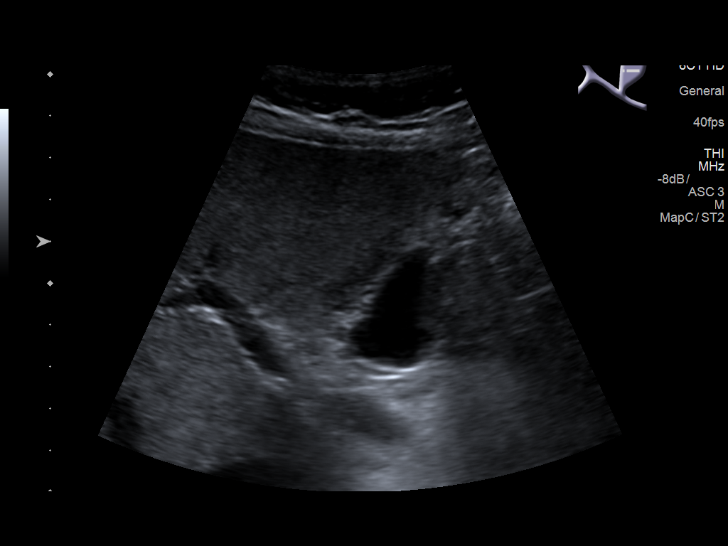
[im 29/116]
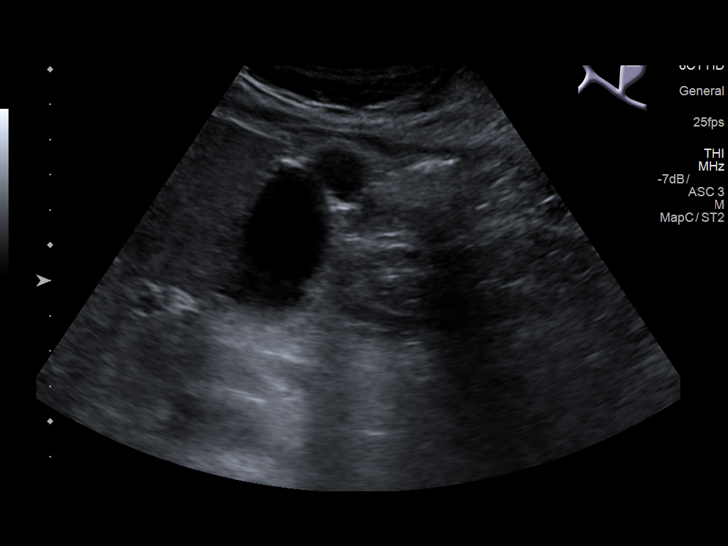
[im 39/116]
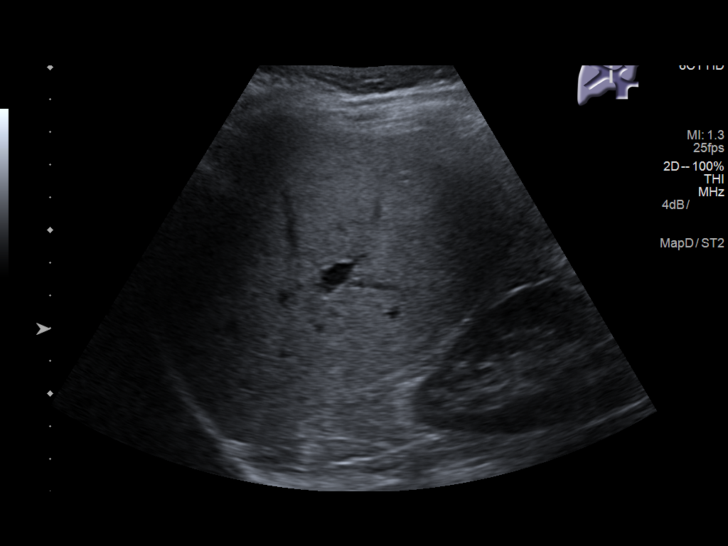
[im 48/116]
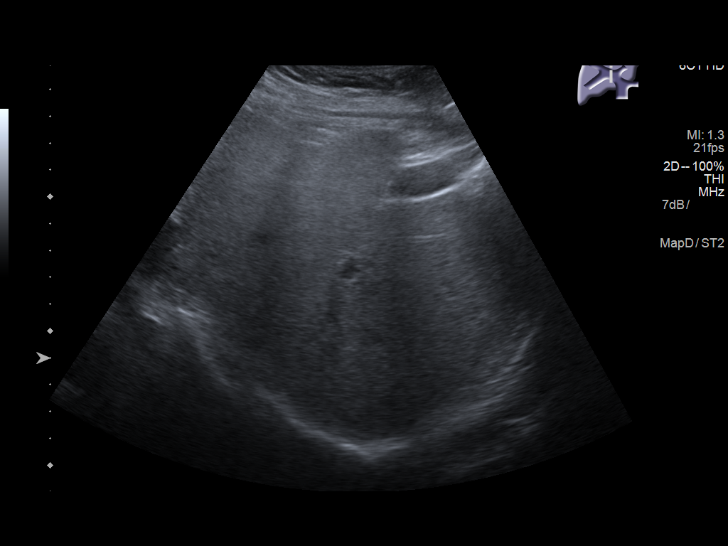
[im 58/116]
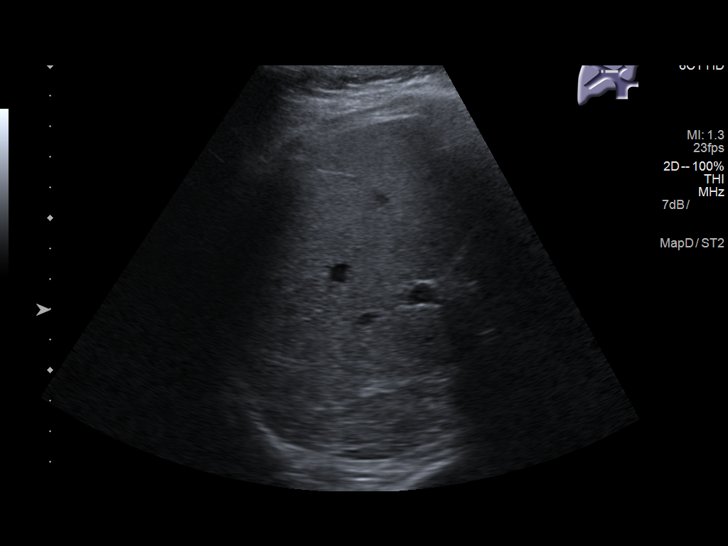
[im 68/116]
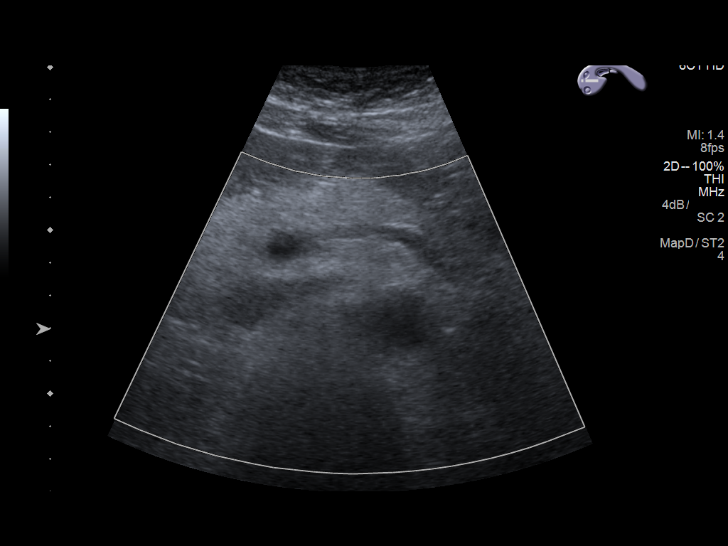
[im 77/116]
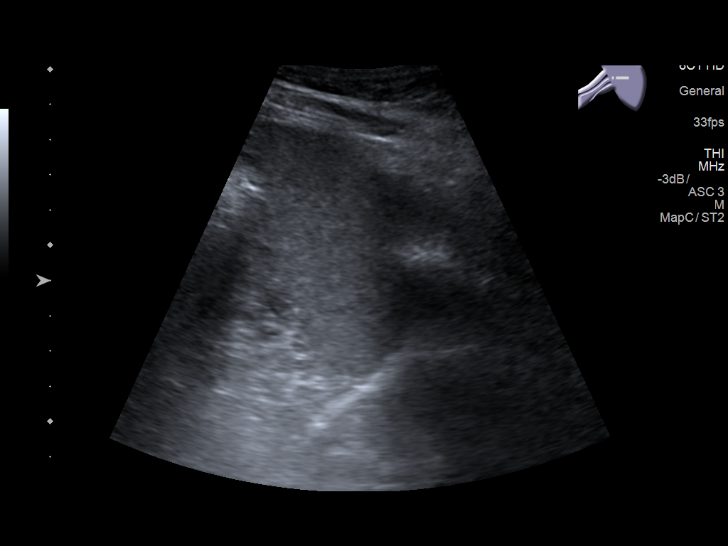
[im 87/116]
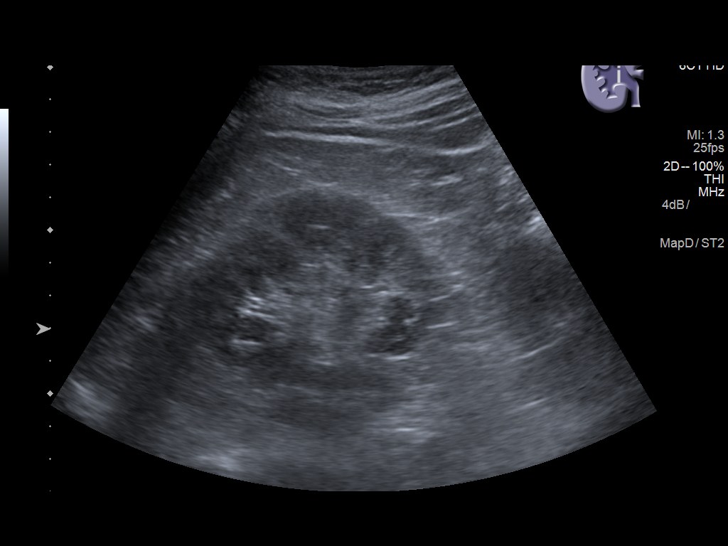
[im 96/116]
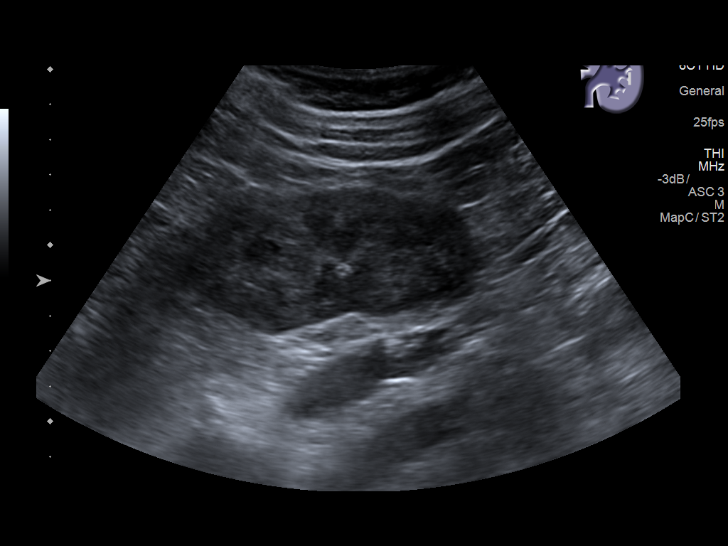
[im 106/116]
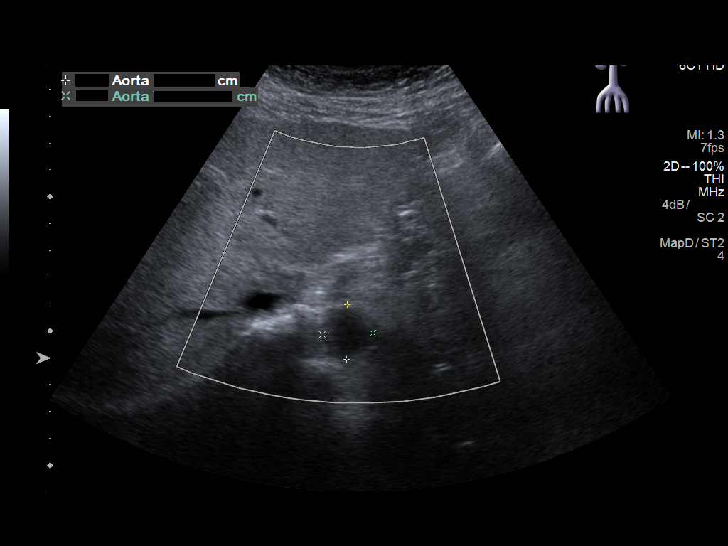
[im 116/116]
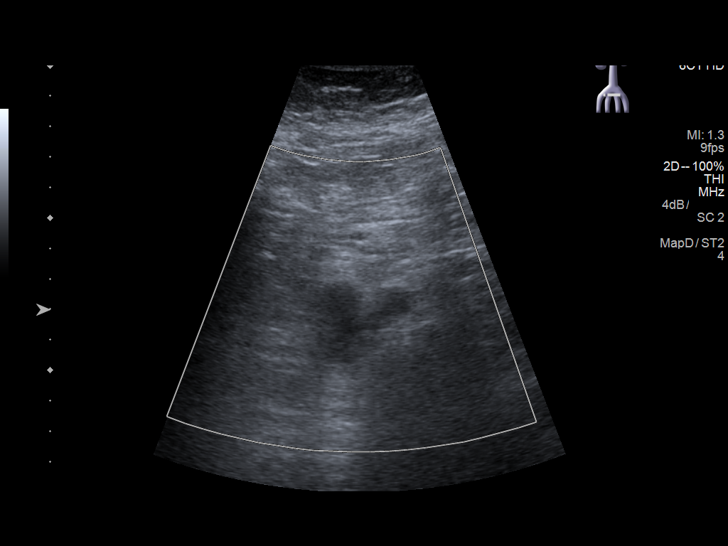

[13 of 25 positions shown; findings below may reference images not displayed]

FINDINGS: Gallbladder: The gallbladder is visualized and no gallstones are
noted. There is no pain over the gallbladder with compression.

Common bile duct: Diameter: The common bile duct is normal measuring
2.8 mm in diameter.

Liver: The parenchyma of the liver is echogenic and inhomogeneous
consistent with diffuse fatty infiltration. No focal hepatic
abnormality is noted. Portal vein is patent on color Doppler imaging
with normal direction of blood flow towards the liver.

IVC: No abnormality visualized.

Pancreas: The head and body the pancreas are well seen and appears
normal. The tail of the pancreas is obscured by bowel gas and cannot
be evaluated. However, on recent CT from 07/25/2017, the tail of the
pancreas appear normal.

Spleen: The spleen measures 3.8 cm.

Right Kidney: Length: 9.6 cm..  No hydronephrosis is seen.

Left Kidney: Length: 9.7 cm..  No hydronephrosis is noted.

Abdominal aorta: The abdominal aorta is normal in caliber although
there are changes of abdominal aortic atherosclerosis noted.

Other findings: None.
IMPRESSION: 1. No gallstones.  No pain over the gallbladder.
2. Inhomogeneous echogenic liver parenchyma consistent with diffuse
fatty infiltration. No focal abnormality.
3. Abdominal aortic atherosclerosis.
4. The tail of the pancreas is obscured by bowel gas.

## 2020-01-05 DIAGNOSIS — R1013 Epigastric pain: Secondary | ICD-10-CM | POA: Diagnosis not present

## 2020-01-05 DIAGNOSIS — Z01818 Encounter for other preprocedural examination: Secondary | ICD-10-CM | POA: Diagnosis not present

## 2020-01-08 DIAGNOSIS — E538 Deficiency of other specified B group vitamins: Secondary | ICD-10-CM | POA: Diagnosis not present

## 2020-01-08 DIAGNOSIS — G40219 Localization-related (focal) (partial) symptomatic epilepsy and epileptic syndromes with complex partial seizures, intractable, without status epilepticus: Secondary | ICD-10-CM | POA: Diagnosis not present

## 2020-01-08 DIAGNOSIS — F3342 Major depressive disorder, recurrent, in full remission: Secondary | ICD-10-CM | POA: Diagnosis not present

## 2020-01-08 DIAGNOSIS — E034 Atrophy of thyroid (acquired): Secondary | ICD-10-CM | POA: Diagnosis not present

## 2020-01-08 DIAGNOSIS — K219 Gastro-esophageal reflux disease without esophagitis: Secondary | ICD-10-CM | POA: Diagnosis not present

## 2020-01-08 DIAGNOSIS — I700 Atherosclerosis of aorta: Secondary | ICD-10-CM | POA: Diagnosis not present

## 2020-01-08 DIAGNOSIS — E7800 Pure hypercholesterolemia, unspecified: Secondary | ICD-10-CM | POA: Diagnosis not present

## 2020-01-08 DIAGNOSIS — Z9989 Dependence on other enabling machines and devices: Secondary | ICD-10-CM | POA: Diagnosis not present

## 2020-01-08 DIAGNOSIS — D72818 Other decreased white blood cell count: Secondary | ICD-10-CM | POA: Diagnosis not present

## 2020-01-08 DIAGNOSIS — G4733 Obstructive sleep apnea (adult) (pediatric): Secondary | ICD-10-CM | POA: Diagnosis not present

## 2020-01-08 DIAGNOSIS — R739 Hyperglycemia, unspecified: Secondary | ICD-10-CM | POA: Diagnosis not present

## 2020-01-09 DIAGNOSIS — F5105 Insomnia due to other mental disorder: Secondary | ICD-10-CM | POA: Diagnosis not present

## 2020-01-09 DIAGNOSIS — F411 Generalized anxiety disorder: Secondary | ICD-10-CM | POA: Diagnosis not present

## 2020-01-09 DIAGNOSIS — F332 Major depressive disorder, recurrent severe without psychotic features: Secondary | ICD-10-CM | POA: Diagnosis not present

## 2020-01-09 DIAGNOSIS — R41840 Attention and concentration deficit: Secondary | ICD-10-CM | POA: Diagnosis not present

## 2020-02-12 DIAGNOSIS — G43011 Migraine without aura, intractable, with status migrainosus: Secondary | ICD-10-CM | POA: Diagnosis not present

## 2020-02-12 DIAGNOSIS — G40219 Localization-related (focal) (partial) symptomatic epilepsy and epileptic syndromes with complex partial seizures, intractable, without status epilepticus: Secondary | ICD-10-CM | POA: Diagnosis not present

## 2020-02-12 DIAGNOSIS — Z79899 Other long term (current) drug therapy: Secondary | ICD-10-CM | POA: Diagnosis not present

## 2020-02-12 DIAGNOSIS — R9082 White matter disease, unspecified: Secondary | ICD-10-CM | POA: Diagnosis not present

## 2020-02-12 DIAGNOSIS — R4189 Other symptoms and signs involving cognitive functions and awareness: Secondary | ICD-10-CM | POA: Diagnosis not present

## 2020-02-12 DIAGNOSIS — R2689 Other abnormalities of gait and mobility: Secondary | ICD-10-CM | POA: Diagnosis not present

## 2020-02-15 DIAGNOSIS — R41840 Attention and concentration deficit: Secondary | ICD-10-CM | POA: Diagnosis not present

## 2020-02-15 DIAGNOSIS — F411 Generalized anxiety disorder: Secondary | ICD-10-CM | POA: Diagnosis not present

## 2020-02-15 DIAGNOSIS — F5105 Insomnia due to other mental disorder: Secondary | ICD-10-CM | POA: Diagnosis not present

## 2020-02-15 DIAGNOSIS — F332 Major depressive disorder, recurrent severe without psychotic features: Secondary | ICD-10-CM | POA: Diagnosis not present

## 2020-02-16 ENCOUNTER — Other Ambulatory Visit: Payer: Self-pay | Admitting: Neurology

## 2020-02-16 DIAGNOSIS — R2689 Other abnormalities of gait and mobility: Secondary | ICD-10-CM

## 2020-02-16 DIAGNOSIS — G40219 Localization-related (focal) (partial) symptomatic epilepsy and epileptic syndromes with complex partial seizures, intractable, without status epilepticus: Secondary | ICD-10-CM

## 2020-03-02 ENCOUNTER — Other Ambulatory Visit: Payer: Self-pay

## 2020-03-02 ENCOUNTER — Ambulatory Visit
Admission: RE | Admit: 2020-03-02 | Discharge: 2020-03-02 | Disposition: A | Discharge status: Home / Self Care | Payer: PPO | Source: Ambulatory Visit | Attending: Neurology | Admitting: Neurology

## 2020-03-02 DIAGNOSIS — G40219 Localization-related (focal) (partial) symptomatic epilepsy and epileptic syndromes with complex partial seizures, intractable, without status epilepticus: Secondary | ICD-10-CM | POA: Diagnosis not present

## 2020-03-02 DIAGNOSIS — R2689 Other abnormalities of gait and mobility: Secondary | ICD-10-CM | POA: Diagnosis not present

## 2020-03-02 DIAGNOSIS — R9082 White matter disease, unspecified: Secondary | ICD-10-CM | POA: Diagnosis not present

## 2020-03-02 DIAGNOSIS — R569 Unspecified convulsions: Secondary | ICD-10-CM | POA: Diagnosis not present

## 2020-03-14 DIAGNOSIS — F332 Major depressive disorder, recurrent severe without psychotic features: Secondary | ICD-10-CM | POA: Diagnosis not present

## 2020-03-14 DIAGNOSIS — F411 Generalized anxiety disorder: Secondary | ICD-10-CM | POA: Diagnosis not present

## 2020-03-14 DIAGNOSIS — F5105 Insomnia due to other mental disorder: Secondary | ICD-10-CM | POA: Diagnosis not present

## 2020-03-14 DIAGNOSIS — R41840 Attention and concentration deficit: Secondary | ICD-10-CM | POA: Diagnosis not present

## 2020-04-01 DIAGNOSIS — R1031 Right lower quadrant pain: Secondary | ICD-10-CM | POA: Diagnosis not present

## 2020-04-01 DIAGNOSIS — Z86010 Personal history of colonic polyps: Secondary | ICD-10-CM | POA: Diagnosis not present

## 2020-04-01 DIAGNOSIS — R1013 Epigastric pain: Secondary | ICD-10-CM | POA: Diagnosis not present

## 2020-04-01 DIAGNOSIS — R1032 Left lower quadrant pain: Secondary | ICD-10-CM | POA: Diagnosis not present

## 2020-04-13 ENCOUNTER — Other Ambulatory Visit: Payer: Self-pay

## 2020-04-13 ENCOUNTER — Ambulatory Visit: Payer: PPO | Attending: Neurology

## 2020-04-13 DIAGNOSIS — R269 Unspecified abnormalities of gait and mobility: Secondary | ICD-10-CM | POA: Diagnosis not present

## 2020-04-13 DIAGNOSIS — R2689 Other abnormalities of gait and mobility: Secondary | ICD-10-CM | POA: Diagnosis not present

## 2020-04-13 DIAGNOSIS — R2681 Unsteadiness on feet: Secondary | ICD-10-CM | POA: Diagnosis not present

## 2020-04-13 DIAGNOSIS — R531 Weakness: Secondary | ICD-10-CM | POA: Diagnosis not present

## 2020-04-13 NOTE — Therapy (Signed)
[facility] [address] [place], [place], [address] Phone: [phone]   Fax:  [phone]  Physical Therapy Evaluation  Patient Details  Name: Morgan Good MRN: [id number] Date of Birth: [birth year] Referring Provider (PT): Dr. Jennings Books   Encounter Date: 04/13/2020   PT End of Session - 04/13/20 1046    Visit Number 1    Number of Visits 9    Date for PT Re-Evaluation 05/11/20    Authorization - Visit Number 1    Authorization - Number of Visits 10    PT Start Time 1030    PT Stop Time 1128    PT Time Calculation (min) 58 min    Activity Tolerance Patient tolerated treatment well    Behavior During Therapy [facility] for tasks assessed/performed           Past Medical History:  Diagnosis Date  . Arthritis   . B12 deficiency   . Depression   . Epilepsy ([place])   . GERD (gastroesophageal reflux disease)   . Headache   . Hypercholesterolemia   . Hyperlipidemia   . Hypothyroidism   . Hypothyroidism   . IBS (irritable bowel syndrome)   . Renal stones   . Seizures ([place])   . Shingles   . Sleep apnea   . Stress incontinence   . Visual field loss     Past Surgical History:  Procedure Laterality Date  . COLONOSCOPY WITH PROPOFOL N/A 08/22/2015   Procedure: COLONOSCOPY WITH PROPOFOL;  Surgeon: Manya Silvas, MD;  Location: [facility] ENDOSCOPY;  Service: Endoscopy;  Laterality: N/A;  . ESOPHAGOGASTRODUODENOSCOPY (EGD) WITH PROPOFOL N/A 08/22/2015   Procedure: ESOPHAGOGASTRODUODENOSCOPY (EGD) WITH PROPOFOL;  Surgeon: Manya Silvas, MD;  Location: [facility] ENDOSCOPY;  Service: Endoscopy;  Laterality: N/A;    There were no vitals filed for this visit.    Subjective Assessment - 04/13/20 1032    Subjective Pt. is a 69 y.o. woman with c/o of falls. Pt. has had at 3 falls in the past 6 months, but reports almost falling daily. Pt. states that she is able to get up, but has to ground herself for some time before continuing to walk. Pt.  states that she has had some dizziness in the past, but it was assessed by MD and cleared to not have any inner ear issues from ENT. Pt. reports that she "feels like she's on a rocking boat." Her last fall was this past Friday, she was standing against something and tried to move, and felt very unsteady. Pt. states she is very unsteady outside. Pt. states that she feels very unsteady on her feet, and within the past few weeks felt tingling in R foot, likely from sciatica which is a prexisting condition for her. Pt. has hx of seizures that is controlled with medication. Pt. states that she struggles with stairs. 4 STE in front, 6-8 STE in the back of the house.    Pertinent History pt enjoys antique shopping and thrift shopping, has 6 grandchildren that she very much enjoys babysitting. Pt. is retired.    Patient Stated Goals more mobility, wants to return to walking and exercising    Currently in Pain? No/denies              [facility] PT Assessment - 04/13/20 0001      Assessment   Medical Diagnosis imbalance    Referring Provider (PT) Dr. Jennings Books    Onset Date/Surgical Date 08/07/19  Hand Dominance Left    Next MD Visit november 2021    Prior Therapy yes      Balance Screen   Has the patient fallen in the past 6 months Yes           OBJECTIVE MUSCULOSKELETAL: Tremor: Absent Bulk: Normal Tone: Normal, no clonus  Posture No gross abnormalities noted in standing or seated posture  Gait Gait appears cautious and decreased arm swing bilat. Pt does not currently ambulate with any AD.   Strength R/L out of 5 3/3+ Hip flexion 4/4 Hip abduction 4/4 Hip adduction 3+/4 Knee extension 4/4+ Knee flexion 5/5 Ankle Plantarflexion 4/4 Ankle Dorsiflexion   NEUROLOGICAL: Mental Status Patient is oriented to person, place and time.  Recent memory is intact.  Remote memory is intact.  Attention span and concentration are intact.  Expressive speech is intact.  Patient's fund of  knowledge is within normal limits for educational level.  Sensation Decreased sensation on R lateral calf  Reflexes R/L 2+/1+ Knee Jerk (L3/4) Normal babinski reflex bilat   FUNCTIONAL OUTCOME MEASURES   Results Comments  BERG 40/56 Significant fall risk     Objective measurements completed on examination: See above findings.       Treatment: pt educated about benefit of using SPC for ambulation and PT will education/instruct further at next HEP; also HEP instruction/training for sit to stands and tandem balance exercises- given handouts with instructions and pictures         Plan - 04/13/20 1245    Clinical Impression Statement Pt. is a 69 y/o female with c/o frequent falls. Pt. has decreased sensation only on lateral calf on R leg. Pt. demonstrates decreased strength on bilat LEs as follows: R/L (out of 5) hip flex: 3/3+; hip add: 4/4; hip abd: 4/4; knee ext: 3+/4; knee flex:4 /4+; ankle plantarflex: 5/5; ankle dorsiflex: 4/4. Pt. scored 40/56 on Berg which indicates significant falls risk. Pt. scored 52/63 on FOTO. Pt. ambulates wtih very cautious gait and no arm swing. Pt. given HEP. Pt. will benefit from skilled PT to improve ambulation, balance, and strength to decrease falls risk and improve functional mobility.    Personal Factors and Comorbidities Comorbidity 1    Comorbidities seizure disorder    Stability/Clinical Decision Making Evolving/Moderate complexity    Clinical Decision Making Moderate    Rehab Potential Good    PT Frequency 2x / week    PT Duration 4 weeks    PT Treatment/Interventions ADLs/Self Care Home Management;Gait training;Stair training;Functional mobility training;Therapeutic activities;Therapeutic exercise;Neuromuscular re-education;Balance training;Patient/family education;Vestibular    PT Next Visit Plan floor transfer    Consulted and Agree with Plan of Care Patient           Patient will benefit from skilled therapeutic intervention  in order to improve the following deficits and impairments:  Abnormal gait, Decreased balance, Decreased coordination, Decreased safety awareness, Decreased strength, Impaired sensation, Decreased mobility  Visit Diagnosis: Imbalance  Unsteadiness on feet  Decreased strength  Gait abnormality     Problem List There are no problems to display for this patient.   Carlyle Basques, SPT 04/13/2020, 3:42 PM Merdis Delay, PT, DPT Physical Therapist - [facility]  [facility] [address] [place], [place], [address] Phone: [phone]   Fax:  [phone]  Name: Morgan Good MRN: [id number] Date of Birth: [birth year]

## 2020-04-18 ENCOUNTER — Ambulatory Visit: Payer: PPO

## 2020-04-18 ENCOUNTER — Other Ambulatory Visit: Payer: Self-pay

## 2020-04-18 ENCOUNTER — Encounter: Payer: Self-pay | Admitting: Physical Therapy

## 2020-04-18 DIAGNOSIS — R2689 Other abnormalities of gait and mobility: Secondary | ICD-10-CM | POA: Diagnosis not present

## 2020-04-18 DIAGNOSIS — R269 Unspecified abnormalities of gait and mobility: Secondary | ICD-10-CM

## 2020-04-18 DIAGNOSIS — R2681 Unsteadiness on feet: Secondary | ICD-10-CM

## 2020-04-18 DIAGNOSIS — R531 Weakness: Secondary | ICD-10-CM

## 2020-04-18 NOTE — Therapy (Signed)
[facility] [address] [place], [place], [address] Phone: [phone]   Fax:  [phone]  Physical Therapy Treatment  Patient Details  Name: Morgan Good MRN: [id number] Date of Birth: 08-29-1950 Referring Provider (PT): Dr. Jennings Books   Encounter Date: 04/18/2020   PT End of Session - 04/18/20 0856    Visit Number 2    Number of Visits 9    Date for PT Re-Evaluation 05/11/20    Authorization - Visit Number 2    Authorization - Number of Visits 10    PT Start Time 0900    PT Stop Time 0948    PT Time Calculation (min) 48 min    Activity Tolerance Patient tolerated treatment well    Behavior During Therapy [facility] for tasks assessed/performed           Past Medical History:  Diagnosis Date  . Arthritis   . B12 deficiency   . Depression   . Epilepsy ([place])   . GERD (gastroesophageal reflux disease)   . Headache   . Hypercholesterolemia   . Hyperlipidemia   . Hypothyroidism   . Hypothyroidism   . IBS (irritable bowel syndrome)   . Renal stones   . Seizures ([place])   . Shingles   . Sleep apnea   . Stress incontinence   . Visual field loss     Past Surgical History:  Procedure Laterality Date  . COLONOSCOPY WITH PROPOFOL N/A 08/22/2015   Procedure: COLONOSCOPY WITH PROPOFOL;  Surgeon: Manya Silvas, MD;  Location: [facility] ENDOSCOPY;  Service: Endoscopy;  Laterality: N/A;  . ESOPHAGOGASTRODUODENOSCOPY (EGD) WITH PROPOFOL N/A 08/22/2015   Procedure: ESOPHAGOGASTRODUODENOSCOPY (EGD) WITH PROPOFOL;  Surgeon: Manya Silvas, MD;  Location: [facility] ENDOSCOPY;  Service: Endoscopy;  Laterality: N/A;    There were no vitals filed for this visit.   Subjective Assessment - 04/18/20 0901    Subjective Pt. states that her weekend was spent with 2 of her grandchildren and walked more than she usually does. Pt. reports no falls or near falls. States she feels very unsteady this morning.    Pertinent History pt enjoys antique  shopping and thrift shopping, has 6 grandchildren that she very much enjoys babysitting. Pt. is retired.    Patient Stated Goals more mobility, wants to return to walking and exercising          There.ex:   Nu-Step L3 for 6 mins   Neuro Re-Ed:   Standing cone reaches outside BOS (low/high/side to side): 2x11 cones. CGA+1   Walking over hurdles (laid on side to reduce the clearance): 3 hurdles x4 in // bars. Verbal and tactile cues for arm swing. CGA+1.    Progression to middle hurdle upright: x4    Standing on airex pad: 3 min CGA+1    Standing on airex pad standing fishing for reaching outside BOS on unstable surface: 2x10 fish. Pole in [place] then pole in [place]. CGA+1. Mod Verbal cues to reduce reaching to reinforce reaching outside BOS to challenge balance. Good carryover after VC's for reaching and maintaining weight through B toes. Verbal and tactile cues for hip hinge and maintaining weight on toes to reduce weight through heels. Education on importance of hip hinge and equal  weight distribution through feet when bending over to pick up items and returning to standing.      PT Long Term Goals - 04/13/20 1203      PT LONG TERM GOAL #1   Title  Pt. will improve FOTO to 63 to improve pain free functional mobility.    Baseline initial: 52    Time 4    Period Weeks    Status New    Target Date 05/11/20      PT LONG TERM GOAL #2   Title Pt will improve Berg to 50/56 to improve balance and decrease falls risk.    Baseline initial: 40/56    Time 4    Period Weeks    Status New    Target Date 05/11/20      PT LONG TERM GOAL #3   Title Pt. will improve LE strength bilat by at least 1/2 muscle grade    Baseline R/L (out of 5) hip flex: 3/3+; hip add: 4/4; hip abd: 4/4; knee ext: 3+/4; knee flex:4 /4+; ankle plantarflex: 5/5; ankle dorsiflex: 4/4    Time 4    Period Weeks    Status New    Target Date 05/11/20      PT LONG TERM GOAL #4   Title Pt. will report maintaining balance  when picking object up off floor to improve functional mobility and decrease falls risk.    Baseline initial: pt reports losing balance when picking objects up off floor    Time 4    Period Weeks    Status New    Target Date 05/11/20                 Plan - 04/18/20 0901    Clinical Impression Statement Pt. able to complete standing and dynamic balance, focusing on improving weightshifting forward onto toes instead of on heels. Pt. typically stands with weight shifted posteriorly, required mod verbal and tactile cues throughout session to facilitate more forward weight shifting onto toes. During ambulation, pt continues to demonstrate decreased arm swing bilat and cautious gait. Pt. cued throughout session to relax arms and take larger steps bilat. Pt. will continue to benefit from skilled PT to improve balance and strength to improve gait and decrease falls risk.    Personal Factors and Comorbidities Comorbidity 1    Comorbidities seizure disorder    Stability/Clinical Decision Making Evolving/Moderate complexity    Clinical Decision Making Moderate    Rehab Potential Good    PT Frequency 2x / week    PT Duration 4 weeks    PT Treatment/Interventions ADLs/Self Care Home Management;Gait training;Stair training;Functional mobility training;Therapeutic activities;Therapeutic exercise;Neuromuscular re-education;Balance training;Patient/family education;Vestibular    PT Next Visit Plan floor transfer    Consulted and Agree with Plan of Care Patient           Patient will benefit from skilled therapeutic intervention in order to improve the following deficits and impairments:  Abnormal gait, Decreased balance, Decreased coordination, Decreased safety awareness, Decreased strength, Impaired sensation, Decreased mobility  Visit Diagnosis: Imbalance  Unsteadiness on feet  Decreased strength  Gait abnormality     Problem List There are no problems to display for this  patient.   Morgan Good, SPT 04/18/2020, 10:14 AM  [facility] [address] [place], [place], [address] Phone: [phone]   Fax:  [phone]  Name: Morgan Good MRN: [id number] Date of Birth: [birth year]

## 2020-04-20 ENCOUNTER — Ambulatory Visit: Payer: PPO

## 2020-04-20 ENCOUNTER — Other Ambulatory Visit: Payer: Self-pay

## 2020-04-20 DIAGNOSIS — R2689 Other abnormalities of gait and mobility: Secondary | ICD-10-CM

## 2020-04-20 DIAGNOSIS — R531 Weakness: Secondary | ICD-10-CM

## 2020-04-20 DIAGNOSIS — R2681 Unsteadiness on feet: Secondary | ICD-10-CM

## 2020-04-20 DIAGNOSIS — R269 Unspecified abnormalities of gait and mobility: Secondary | ICD-10-CM

## 2020-04-20 NOTE — Therapy (Signed)
[place] [facility] [address] [place], [place], [address] Phone: [phone]   Fax:  [phone]  Physical Therapy Treatment  Patient Details  Name: Morgan Good MRN: [id number] Date of Birth: 1951/03/21 Referring Provider (PT): Dr. Jennings Books   Encounter Date: 04/20/2020   PT End of Session - 04/20/20 0943    Visit Number 3    Number of Visits 9    Date for PT Re-Evaluation 05/11/20    Authorization - Visit Number 3    Authorization - Number of Visits 10    PT Start Time 7322    PT Stop Time 1035    PT Time Calculation (min) 51 min    Activity Tolerance Patient tolerated treatment well    Behavior During Therapy [facility] for tasks assessed/performed           Past Medical History:  Diagnosis Date  . Arthritis   . B12 deficiency   . Depression   . Epilepsy ([place])   . GERD (gastroesophageal reflux disease)   . Headache   . Hypercholesterolemia   . Hyperlipidemia   . Hypothyroidism   . Hypothyroidism   . IBS (irritable bowel syndrome)   . Renal stones   . Seizures ([place])   . Shingles   . Sleep apnea   . Stress incontinence   . Visual field loss     Past Surgical History:  Procedure Laterality Date  . COLONOSCOPY WITH PROPOFOL N/A 08/22/2015   Procedure: COLONOSCOPY WITH PROPOFOL;  Surgeon: Manya Silvas, MD;  Location: [facility] ENDOSCOPY;  Service: Endoscopy;  Laterality: N/A;  . ESOPHAGOGASTRODUODENOSCOPY (EGD) WITH PROPOFOL N/A 08/22/2015   Procedure: ESOPHAGOGASTRODUODENOSCOPY (EGD) WITH PROPOFOL;  Surgeon: Manya Silvas, MD;  Location: [facility] ENDOSCOPY;  Service: Endoscopy;  Laterality: N/A;    There were no vitals filed for this visit.   Subjective Assessment - 04/20/20 0944    Subjective Pt. states that she did not sleep well last night and is very tired.    Pertinent History pt enjoys antique shopping and thrift shopping, has 6 grandchildren that she very much enjoys babysitting. Pt. is retired.    Patient Stated  Goals more mobility, wants to return to walking and exercising    Currently in Pain? No/denies           There Ex:  5 mins Nustep: L1. Discussed plan of therapy today.   Floor transfer: Pt instructed in technique and completed 3 total reps. Pt used chair each rep to come to floor, completed 1x without use of chair to get up. Pt provided CGA for all, buy may not have been required for last two repetitions.   STS: 10x with no UE usage. Pt instructed in technique and required verbal and tactile cues for half of the reps to maintain form and technique.  Neuro Re-ed:   Cone reaches outside BOS: Pt instructed to reach for cones outside BOS and to utilize stepping strategy. Pt able to complete multidirectional reaches, reports that she feels most unsteady when reaching down. Pt completed 2x15 cone reaches with CGA. Initially required mod verbal cueing to take steps when outside BOS, and to take larger steps.  //bars standing multidirectional perturbations: Pt standing in bars and PT providing perturbations in multiple direction, pt instructed to utilize stepping strategy if too far outside BOS. Pt required CGA throughout, particularly with backwards perturbations. Pt required cueing to take larger posterior steps with backwards perturbations, pt improved ability to complete activity. Pt  progressed to dual tasking with perturbations.           PT Long Term Goals - 04/13/20 1203      PT LONG TERM GOAL #1   Title Pt. will improve FOTO to 63 to improve pain free functional mobility.    Baseline initial: 52    Time 4    Period Weeks    Status New    Target Date 05/11/20      PT LONG TERM GOAL #2   Title Pt will improve Berg to 50/56 to improve balance and decrease falls risk.    Baseline initial: 40/56    Time 4    Period Weeks    Status New    Target Date 05/11/20      PT LONG TERM GOAL #3   Title Pt. will improve LE strength bilat by at least 1/2 muscle grade    Baseline R/L  (out of 5) hip flex: 3/3+; hip add: 4/4; hip abd: 4/4; knee ext: 3+/4; knee flex:4 /4+; ankle plantarflex: 5/5; ankle dorsiflex: 4/4    Time 4    Period Weeks    Status New    Target Date 05/11/20      PT LONG TERM GOAL #4   Title Pt. will report maintaining balance when picking object up off floor to improve functional mobility and decrease falls risk.    Baseline initial: pt reports losing balance when picking objects up off floor    Time 4    Period Weeks    Status New    Target Date 05/11/20                 Plan - 04/20/20 1103    Clinical Impression Statement Pt. demonstrates improved carryover with standing balance without leaning back on heels as often. Pt. practiced STS technique and given as HEP. Pt. completed floor transfers with good technique, pt would like to practice more in therapy before feeling confident. Pt. demonstrated improved carryover with ankle and hip strategies, was instructed on how to utilize stepping strategy today in all directions. Pt. able to complete stepping strategies with cone reaches and standing perturbations. Pt. will continue to benefit from skilled PT to improve balance and strength to decrease falls risk.    Personal Factors and Comorbidities Comorbidity 1    Comorbidities seizure disorder    Stability/Clinical Decision Making Evolving/Moderate complexity    Clinical Decision Making Moderate    Rehab Potential Good    PT Frequency 2x / week    PT Duration 4 weeks    PT Treatment/Interventions ADLs/Self Care Home Management;Gait training;Stair training;Functional mobility training;Therapeutic activities;Therapeutic exercise;Neuromuscular re-education;Balance training;Patient/family education;Vestibular    PT Next Visit Plan cardio education for home bike    Consulted and Agree with Plan of Care Patient           Patient will benefit from skilled therapeutic intervention in order to improve the following deficits and impairments:   Abnormal gait, Decreased balance, Decreased coordination, Decreased safety awareness, Decreased strength, Impaired sensation, Decreased mobility  Visit Diagnosis: Imbalance  Unsteadiness on feet  Gait abnormality  Decreased strength     Problem List There are no problems to display for this patient.   Carlyle Basques, SPT 04/20/2020, 11:10 AM  [place] [facility] [address] [place], [place], [address] Phone: [phone]   Fax:  [phone]  Name: Morgan Good MRN: [id number] Date of Birth: Sep 14, 1950

## 2020-04-25 ENCOUNTER — Ambulatory Visit: Payer: PPO | Admitting: Physical Therapy

## 2020-04-27 ENCOUNTER — Encounter: Payer: Self-pay | Admitting: Physical Therapy

## 2020-04-27 ENCOUNTER — Ambulatory Visit: Payer: PPO | Admitting: Physical Therapy

## 2020-04-27 ENCOUNTER — Other Ambulatory Visit: Payer: Self-pay

## 2020-04-27 DIAGNOSIS — R2681 Unsteadiness on feet: Secondary | ICD-10-CM

## 2020-04-27 DIAGNOSIS — R2689 Other abnormalities of gait and mobility: Secondary | ICD-10-CM | POA: Diagnosis not present

## 2020-04-27 DIAGNOSIS — R269 Unspecified abnormalities of gait and mobility: Secondary | ICD-10-CM

## 2020-04-27 DIAGNOSIS — R531 Weakness: Secondary | ICD-10-CM

## 2020-04-27 NOTE — Therapy (Signed)
[place] [facility] [address] [place], [place], [address] Phone: [phone]   Fax:  [phone]  Physical Therapy Treatment  Patient Details  Name: Morgan Good MRN: [id number] Date of Birth: Oct 10, 1950 Referring Provider (PT): Dr. Jennings Books   Encounter Date: 04/27/2020   PT End of Session - 04/27/20 0951    Visit Number 4    Number of Visits 9    Date for PT Re-Evaluation 05/11/20    Authorization - Visit Number 4    Authorization - Number of Visits 10    PT Start Time 0945    PT Stop Time 1033    PT Time Calculation (min) 48 min    Equipment Utilized During Treatment Gait belt    Activity Tolerance Patient tolerated treatment well    Behavior During Therapy [facility] for tasks assessed/performed           Past Medical History:  Diagnosis Date  . Arthritis   . B12 deficiency   . Depression   . Epilepsy ([place])   . GERD (gastroesophageal reflux disease)   . Headache   . Hypercholesterolemia   . Hyperlipidemia   . Hypothyroidism   . Hypothyroidism   . IBS (irritable bowel syndrome)   . Renal stones   . Seizures ([place])   . Shingles   . Sleep apnea   . Stress incontinence   . Visual field loss     Past Surgical History:  Procedure Laterality Date  . COLONOSCOPY WITH PROPOFOL N/A 08/22/2015   Procedure: COLONOSCOPY WITH PROPOFOL;  Surgeon: Manya Silvas, MD;  Location: [facility] ENDOSCOPY;  Service: Endoscopy;  Laterality: N/A;  . ESOPHAGOGASTRODUODENOSCOPY (EGD) WITH PROPOFOL N/A 08/22/2015   Procedure: ESOPHAGOGASTRODUODENOSCOPY (EGD) WITH PROPOFOL;  Surgeon: Manya Silvas, MD;  Location: [facility] ENDOSCOPY;  Service: Endoscopy;  Laterality: N/A;    There were no vitals filed for this visit.   Subjective Assessment - 04/27/20 0950    Subjective Pt. states that she had a horrible migraine this past Monday. She reports that her balance has been feeling better.    Pertinent History pt enjoys antique shopping and thrift  shopping, has 6 grandchildren that she very much enjoys babysitting. Pt. is retired.    Patient Stated Goals more mobility, wants to return to walking and exercising    Currently in Pain? No/denies             There Ex:  Nustep: 10 mins total: L2 7 mins, L0 3 mins. (unbilled)  Neuro re-ed:  Walking in hallway with vertical and horizontal head turns: pt walking 5x length of hallway, instructed for either vertical or horizontal head turns. Pt able maintain straight line walking with very minimal slowing of gait during vertical head turns, demonstrated greater difficulty with staying in a straight line and maintaining gait speed with horizontal head turns. Pt instructed to initially complete head turns in standing, and then walk, which improved ability to stay straight.   Standing marches with no UE support: 15x. Pt cued for decreasing speed of marches, and maintaining weight off heels.   Walking over blue mat with ankle weights underneath in // bars: pt provided CGA throughout. Pt initially walked slowly with stiff arms and staring at feet, demonstrated slight LOB but was able to correct due to how slow she was walking. Pt instructed to walk with increased speed, relax her arms, and to look up and forward. Pt demonstrated tendency to continue falling with trunk if LOB  occurred. Pt cued on maintaining her weight over her base of support while walking. Pt progressed to dual tasking while walking over mat.   Walking over 6" hurdles in // bars: pt initially demonstrated decreased speed when stepping over, reliance on staring at objects to be able to complete, and no arm swing. Pt instructed to initially relax arms and to maintain gait speed when walking over objects. Pt then was instructed to look up and forward. Pt demonstrated difficulty with not being able to look at objects. Pt instructed to make a "game plan" prior to walking, which included assessing the environment and making a guess as to  where she would need to put her feet relative to the object to step over it. This improved pt's ability to complete activity without staring at objects.         PT Long Term Goals - 04/13/20 1203      PT LONG TERM GOAL #1   Title Pt. will improve FOTO to 63 to improve pain free functional mobility.    Baseline initial: 52    Time 4    Period Weeks    Status New    Target Date 05/11/20      PT LONG TERM GOAL #2   Title Pt will improve Berg to 50/56 to improve balance and decrease falls risk.    Baseline initial: 40/56    Time 4    Period Weeks    Status New    Target Date 05/11/20      PT LONG TERM GOAL #3   Title Pt. will improve LE strength bilat by at least 1/2 muscle grade    Baseline R/L (out of 5) hip flex: 3/3+; hip add: 4/4; hip abd: 4/4; knee ext: 3+/4; knee flex:4 /4+; ankle plantarflex: 5/5; ankle dorsiflex: 4/4    Time 4    Period Weeks    Status New    Target Date 05/11/20      PT LONG TERM GOAL #4   Title Pt. will report maintaining balance when picking object up off floor to improve functional mobility and decrease falls risk.    Baseline initial: pt reports losing balance when picking objects up off floor    Time 4    Period Weeks    Status New    Target Date 05/11/20                 Plan - 04/27/20 1209    Clinical Impression Statement Pt. continues to demonstrate good carryover from last session with ability to stand without rocking back on heels. Pt. practiced multiple dynamic balance activities including walking with horizontal/vertical head turns, walking over uneven surfaces, and stepping over objects. Pt. progressed to being able to dual task with all activities except stepping over objects. With stepping over objects, pt instructed to assess environment prior to beginning activity, which allowed her to plan for how she would be stepping over objects. This improved her balance and abiltiy to complete activity. Pt. will continue to benefit from  skilled PT to improve balance and strength to decrease falls risk.    Personal Factors and Comorbidities Comorbidity 1    Comorbidities seizure disorder    Stability/Clinical Decision Making Evolving/Moderate complexity    Clinical Decision Making Moderate    Rehab Potential Good    PT Frequency 2x / week    PT Duration 4 weeks    PT Treatment/Interventions ADLs/Self Care Home Management;Gait training;Stair training;Functional mobility training;Therapeutic activities;Therapeutic exercise;Neuromuscular re-education;Balance  training;Patient/family education;Vestibular    PT Next Visit Plan cardio education for home bike    Consulted and Agree with Plan of Care Patient           Patient will benefit from skilled therapeutic intervention in order to improve the following deficits and impairments:  Abnormal gait, Decreased balance, Decreased coordination, Decreased safety awareness, Decreased strength, Impaired sensation, Decreased mobility  Visit Diagnosis: Imbalance  Unsteadiness on feet  Gait abnormality  Decreased strength     Problem List There are no problems to display for this patient.  Pura Spice, PT, DPT # [id number] CHYIF, SPT 04/27/2020, 2:23 PM  [place] [facility] [address] [place], [place], [address] Phone: [phone]   Fax:  [phone]  Name: AVELINE DAUS MRN: [id number] Date of Birth: [birth year]

## 2020-04-28 ENCOUNTER — Other Ambulatory Visit
Admission: RE | Admit: 2020-04-28 | Discharge: 2020-04-28 | Disposition: A | Discharge status: Home / Self Care | Payer: PPO | Source: Ambulatory Visit | Attending: Gastroenterology | Admitting: Gastroenterology

## 2020-04-28 ENCOUNTER — Other Ambulatory Visit: Payer: Self-pay | Admitting: Internal Medicine

## 2020-04-28 DIAGNOSIS — Z1231 Encounter for screening mammogram for malignant neoplasm of breast: Secondary | ICD-10-CM

## 2020-04-28 DIAGNOSIS — Z20822 Contact with and (suspected) exposure to covid-19: Secondary | ICD-10-CM | POA: Diagnosis not present

## 2020-04-28 DIAGNOSIS — Z01812 Encounter for preprocedural laboratory examination: Secondary | ICD-10-CM | POA: Diagnosis not present

## 2020-04-28 LAB — SARS CORONAVIRUS 2 (TAT 6-24 HRS): SARS Coronavirus 2: NEGATIVE

## 2020-04-29 ENCOUNTER — Encounter: Payer: Self-pay | Admitting: *Deleted

## 2020-05-02 ENCOUNTER — Encounter: Payer: Self-pay | Admitting: Anesthesiology

## 2020-05-02 ENCOUNTER — Ambulatory Visit: Payer: PPO | Admitting: Physical Therapy

## 2020-05-02 ENCOUNTER — Ambulatory Visit: Payer: PPO | Admitting: Anesthesiology

## 2020-05-02 ENCOUNTER — Ambulatory Visit
Admission: RE | Admit: 2020-05-02 | Discharge: 2020-05-02 | Disposition: A | Discharge status: Home / Self Care | Payer: PPO | Attending: Gastroenterology | Admitting: Gastroenterology

## 2020-05-02 ENCOUNTER — Encounter
Admission: RE | Disposition: A | Discharge status: Home / Self Care | Payer: Self-pay | Source: Home / Self Care | Attending: Gastroenterology

## 2020-05-02 DIAGNOSIS — R194 Change in bowel habit: Secondary | ICD-10-CM | POA: Diagnosis not present

## 2020-05-02 DIAGNOSIS — E039 Hypothyroidism, unspecified: Secondary | ICD-10-CM | POA: Diagnosis not present

## 2020-05-02 DIAGNOSIS — R1031 Right lower quadrant pain: Secondary | ICD-10-CM | POA: Diagnosis not present

## 2020-05-02 DIAGNOSIS — E538 Deficiency of other specified B group vitamins: Secondary | ICD-10-CM | POA: Insufficient documentation

## 2020-05-02 DIAGNOSIS — Z79899 Other long term (current) drug therapy: Secondary | ICD-10-CM | POA: Diagnosis not present

## 2020-05-02 DIAGNOSIS — K30 Functional dyspepsia: Secondary | ICD-10-CM | POA: Diagnosis not present

## 2020-05-02 DIAGNOSIS — Z800 Family history of malignant neoplasm of digestive organs: Secondary | ICD-10-CM | POA: Diagnosis not present

## 2020-05-02 DIAGNOSIS — E785 Hyperlipidemia, unspecified: Secondary | ICD-10-CM | POA: Insufficient documentation

## 2020-05-02 DIAGNOSIS — K641 Second degree hemorrhoids: Secondary | ICD-10-CM | POA: Diagnosis not present

## 2020-05-02 DIAGNOSIS — F418 Other specified anxiety disorders: Secondary | ICD-10-CM | POA: Diagnosis not present

## 2020-05-02 DIAGNOSIS — K5730 Diverticulosis of large intestine without perforation or abscess without bleeding: Secondary | ICD-10-CM | POA: Diagnosis not present

## 2020-05-02 DIAGNOSIS — G40909 Epilepsy, unspecified, not intractable, without status epilepticus: Secondary | ICD-10-CM | POA: Insufficient documentation

## 2020-05-02 DIAGNOSIS — R1013 Epigastric pain: Secondary | ICD-10-CM | POA: Insufficient documentation

## 2020-05-02 DIAGNOSIS — R1032 Left lower quadrant pain: Secondary | ICD-10-CM | POA: Diagnosis not present

## 2020-05-02 DIAGNOSIS — Z7952 Long term (current) use of systemic steroids: Secondary | ICD-10-CM | POA: Insufficient documentation

## 2020-05-02 DIAGNOSIS — F419 Anxiety disorder, unspecified: Secondary | ICD-10-CM | POA: Diagnosis not present

## 2020-05-02 DIAGNOSIS — F329 Major depressive disorder, single episode, unspecified: Secondary | ICD-10-CM | POA: Insufficient documentation

## 2020-05-02 DIAGNOSIS — G4730 Sleep apnea, unspecified: Secondary | ICD-10-CM | POA: Diagnosis not present

## 2020-05-02 DIAGNOSIS — E7800 Pure hypercholesterolemia, unspecified: Secondary | ICD-10-CM | POA: Diagnosis not present

## 2020-05-02 DIAGNOSIS — R1030 Lower abdominal pain, unspecified: Secondary | ICD-10-CM | POA: Insufficient documentation

## 2020-05-02 DIAGNOSIS — K219 Gastro-esophageal reflux disease without esophagitis: Secondary | ICD-10-CM | POA: Insufficient documentation

## 2020-05-02 DIAGNOSIS — K649 Unspecified hemorrhoids: Secondary | ICD-10-CM | POA: Diagnosis not present

## 2020-05-02 DIAGNOSIS — Z86010 Personal history of colonic polyps: Secondary | ICD-10-CM | POA: Diagnosis not present

## 2020-05-02 HISTORY — DX: Anxiety disorder, unspecified: F41.9

## 2020-05-02 HISTORY — PX: ESOPHAGOGASTRODUODENOSCOPY (EGD) WITH PROPOFOL: SHX5813

## 2020-05-02 HISTORY — PX: COLONOSCOPY WITH PROPOFOL: SHX5780

## 2020-05-02 SURGERY — COLONOSCOPY WITH PROPOFOL
Anesthesia: General

## 2020-05-02 MED ORDER — SODIUM CHLORIDE 0.9 % IV SOLN: INTRAVENOUS | Status: DC

## 2020-05-02 MED ORDER — PROPOFOL 500 MG/50ML IV EMUL
INTRAVENOUS | Status: AC
  Filled 2020-05-02: qty 50

## 2020-05-02 MED ORDER — PROPOFOL 500 MG/50ML IV EMUL
INTRAVENOUS | Status: DC | PRN
  Administered 2020-05-02: 125 ug/kg/min via INTRAVENOUS

## 2020-05-02 NOTE — Anesthesia Preprocedure Evaluation (Signed)
Anesthesia Evaluation  Patient identified by MRN, date of birth, ID band Patient awake    Reviewed: Allergy & Precautions, NPO status , Patient's Chart, lab work & pertinent test results  History of Anesthesia Complications Negative for: history of anesthetic complications  Airway Mallampati: III       Dental   Pulmonary sleep apnea and Continuous Positive Airway Pressure Ventilation , neg COPD, Not current smoker,           Cardiovascular (-) hypertension(-) Past MI and (-) CHF (-) dysrhythmias (-) Valvular Problems/Murmurs     Neuro/Psych Seizures - (last 2 years ago), Well Controlled,  Anxiety Depression    GI/Hepatic Neg liver ROS, GERD  Medicated,  Endo/Other  neg diabetesHypothyroidism   Renal/GU Renal disease (stones)     Musculoskeletal   Abdominal   Peds  Hematology   Anesthesia Other Findings   Reproductive/Obstetrics                             Anesthesia Physical Anesthesia Plan  ASA: III  Anesthesia Plan: General   Post-op Pain Management:    Induction: Intravenous  PONV Risk Score and Plan: 3 and Propofol infusion, TIVA and Treatment may vary due to age or medical condition  Airway Management Planned: Nasal Cannula  Additional Equipment:   Intra-op Plan:   Post-operative Plan:   Informed Consent: I have reviewed the patients History and Physical, chart, labs and discussed the procedure including the risks, benefits and alternatives for the proposed anesthesia with the patient or authorized representative who has indicated his/her understanding and acceptance.       Plan Discussed with:   Anesthesia Plan Comments:         Anesthesia Quick Evaluation

## 2020-05-02 NOTE — Interval H&P Note (Signed)
History and Physical Interval Note:  05/02/2020 1:08 PM  Morgan Good  has presented today for surgery, with the diagnosis of dyspepsia BH change bilat lower abd pain.  The various methods of treatment have been discussed with the patient and family. After consideration of risks, benefits and other options for treatment, the patient has consented to  Procedure(s): COLONOSCOPY WITH PROPOFOL (N/A) ESOPHAGOGASTRODUODENOSCOPY (EGD) WITH PROPOFOL (N/A) as a surgical intervention.  The patient's history has been reviewed, patient examined, no change in status, stable for surgery.  I have reviewed the patient's chart and labs.  Questions were answered to the patient's satisfaction.     Lesly Rubenstein   Ok to proceed with EGD/Colonoscopy

## 2020-05-02 NOTE — Transfer of Care (Signed)
Immediate Anesthesia Transfer of Care Note  Patient: Morgan Good  Procedure(s) Performed: COLONOSCOPY WITH PROPOFOL (N/A ) ESOPHAGOGASTRODUODENOSCOPY (EGD) WITH PROPOFOL (N/A )  Patient Location: PACU  Anesthesia Type:General  Level of Consciousness: awake and sedated  Airway & Oxygen Therapy: Patient Spontanous Breathing and Patient connected to nasal cannula oxygen  Post-op Assessment: Report given to RN and Post -op Vital signs reviewed and stable  Post vital signs: Reviewed and stable  Last Vitals:  Vitals Value Taken Time  BP    Temp    Pulse    Resp    SpO2      Last Pain:  Vitals:   05/02/20 1211  TempSrc: Temporal  PainSc: 0-No pain      Patients Stated Pain Goal: 0 (50/93/26 7124)  Complications: No complications documented.

## 2020-05-02 NOTE — Anesthesia Postprocedure Evaluation (Signed)
Anesthesia Post Note  Patient: Morgan Good  Procedure(s) Performed: COLONOSCOPY WITH PROPOFOL (N/A ) ESOPHAGOGASTRODUODENOSCOPY (EGD) WITH PROPOFOL (N/A )  Patient location during evaluation: Endoscopy Anesthesia Type: General Level of consciousness: awake and alert Pain management: pain level controlled Vital Signs Assessment: post-procedure vital signs reviewed and stable Respiratory status: spontaneous breathing and respiratory function stable Cardiovascular status: stable Anesthetic complications: no   No complications documented.   Last Vitals:  Vitals:   05/02/20 1400 05/02/20 1410  BP: (!) 115/58 135/89  Pulse: (!) 46 (!) 50  Resp: 20 18  Temp: 36.8 C   SpO2: 100% 97%    Last Pain:  Vitals:   05/02/20 1420  TempSrc:   PainSc: 0-No pain                 Remijio Holleran K

## 2020-05-02 NOTE — Op Note (Signed)
[facility]astroenter[facility] Patient Name: Morgan Good Procedure Date: 05/02/2020 1:03 PM MRN: [id number] Account #: [id number] Date of Birth: 07/28/1951 Admit Type: Outpatient Age: 69 Room: [facility] ENDO ROOM 2 Gender: Female Note Status: Finalized Procedure:             Colonoscopy Indications:           Lower abdominal pain Providers:             Andrey Farmer MD, MD Medicines:             Monitored Anesthesia Care Complications:         No immediate complications. Procedure:             Pre-Anesthesia Assessment:                        - Prior to the procedure, a History and Physical was                         performed, and patient medications and allergies were                         reviewed. The patient is competent. The risks and                         benefits of the procedure and the sedation options and                         risks were discussed with the patient. All questions                         were answered and informed consent was obtained.                         Patient identification and proposed procedure were                         verified by the physician, the nurse, the anesthetist                         and the technician in the endoscopy suite. Mental                         Status Examination: alert and oriented. Airway                         Examination: normal oropharyngeal airway and neck                         mobility. Respiratory Examination: clear to                         auscultation. CV Examination: normal. Prophylactic                         Antibiotics: The patient does not require prophylactic                         antibiotics. Prior Anticoagulants: The patient has  taken no previous anticoagulant or antiplatelet                         agents. ASA Grade Assessment: II - A patient with mild                         systemic disease. After reviewing the risks and                          benefits, the patient was deemed in satisfactory                         condition to undergo the procedure. The anesthesia                         plan was to use monitored anesthesia care (MAC).                         Immediately prior to administration of medications,                         the patient was re-assessed for adequacy to receive                         sedatives. The heart rate, respiratory rate, oxygen                         saturations, blood pressure, adequacy of pulmonary                         ventilation, and response to care were monitored                         throughout the procedure. The physical status of the                         patient was re-assessed after the procedure.                        After obtaining informed consent, the colonoscope was                         passed under direct vision. Throughout the procedure,                         the patient's blood pressure, pulse, and oxygen                         saturations were monitored continuously. The                         Colonoscope was introduced through the anus and                         advanced to the the cecum, identified by appendiceal                         orifice and ileocecal valve. The colonoscopy was  performed without difficulty. The patient tolerated                         the procedure well. The quality of the bowel                         preparation was good. Findings:      The perianal and digital rectal examinations were normal.      A single small-mouthed diverticulum was found in the sigmoid colon.      Non-bleeding internal hemorrhoids were found during retroflexion. The       hemorrhoids were Grade II (internal hemorrhoids that prolapse but reduce       spontaneously).      The exam was otherwise without abnormality on direct and retroflexion       views. Impression:            - Diverticulosis in the sigmoid colon.                         - Non-bleeding internal hemorrhoids.                        - The examination was otherwise normal on direct and                         retroflexion views.                        - No specimens collected. Recommendation:        - Discharge patient to home.                        - Resume previous diet.                        - Continue present medications.                        - Repeat colonoscopy in 5 years for surveillance.                        - Return to referring physician as previously                         scheduled. Procedure Code(s):     --- Professional ---                        [phone], Colonoscopy, flexible; diagnostic, including                         collection of specimen(s) by brushing or washing, when                         performed (separate procedure) Diagnosis Code(s):     --- Professional ---                        K64.1, Second degree hemorrhoids                        R10.30, Lower abdominal pain, unspecified  K57.30, Diverticulosis of large intestine without                         perforation or abscess without bleeding CPT copyright 2019 American Medical Association. All rights reserved. The codes documented in this report are preliminary and upon coder review may  be revised to meet current compliance requirements. Andrey Farmer, MD Andrey Farmer MD, MD 05/02/2020 2:01:32 PM Number of Addenda: 0 Note Initiated On: 05/02/2020 1:03 PM Scope Withdrawal Time: 0 hours 15 minutes 50 seconds  Total Procedure Duration: 0 hours 25 minutes 4 seconds  Estimated Blood Loss:  Estimated blood loss: none.      [facility]

## 2020-05-02 NOTE — H&P (Signed)
Outpatient short stay form Pre-procedure 05/02/2020 1:04 PM Raylene Miyamoto MD, MPH  Primary Physician: Dr. Caryl Comes  Reason for visit:  Surveillance/Dyspepsia  History of present illness:   69 y/o lady with history of colon polyps here with lower abdominal pain and bloating. Has family history of colon cancer in sister and uncles at young age. Last colonoscopy 5 years ago with small polyp. No blood thinners or abdominal surgeries.    Current Facility-Administered Medications:    0.9 %  sodium chloride infusion, , Intravenous, Continuous, Cameo Shewell, Hilton Cork, MD, Last Rate: 20 mL/hr at 05/02/20 1246, New Bag at 05/02/20 1246  Medications Prior to Admission  Medication Sig Dispense Refill Last Dose   azelastine (ASTELIN) 0.1 % nasal spray Place 1 spray into both nostrils 2 (two) times daily. Use in each nostril as directed   Past Week at Unknown time   cetirizine (ZYRTEC) 10 MG tablet Take 10 mg by mouth daily as needed for allergies.   Past Week at Unknown time   lamoTRIgine (LAMICTAL) 100 MG tablet Take 100 mg by mouth 2 (two) times daily.    05/01/2020 at Unknown time   lovastatin (MEVACOR) 40 MG tablet Take 40 mg by mouth at bedtime.   05/01/2020 at Unknown time   pantoprazole (PROTONIX) 40 MG tablet Take 40 mg by mouth daily.   05/01/2020 at Unknown time   Polyethyl Glycol-Propyl Glycol (SYSTANE OP) Place 1-2 drops into both eyes 2 (two) times daily.   05/01/2020 at Unknown time   Sertraline HCl (ZOLOFT PO) Take 50 mg by mouth.   05/01/2020 at 0700   traZODone (DESYREL) 100 MG tablet Take 100 mg by mouth at bedtime as needed for sleep.   05/01/2020 at Unknown time   vitamin B-12 (CYANOCOBALAMIN) 1000 MCG tablet Take 1,000 mcg by mouth daily.   05/01/2020 at Unknown time   levothyroxine (SYNTHROID, LEVOTHROID) 75 MCG tablet Take 75 mcg by mouth daily before breakfast. (Patient not taking: Reported on 05/02/2020)   Completed Course at 0700   Linaclotide (LINZESS) 145 MCG CAPS capsule  Take 145 mcg by mouth daily. (Patient not taking: Reported on 05/02/2020)   Completed Course at Unknown time   predniSONE (DELTASONE) 10 MG tablet 40 mg by mouth for 2 days 30 mg by mouth for 2 days 20 mg by mouth for 2 days 10 mg by mouth for 2 days. (Patient not taking: Reported on 05/02/2020) 20 tablet 0 Not Taking at Unknown time   predniSONE (DELTASONE) 50 MG tablet Take 50 mg by mouth daily with breakfast.      venlafaxine (EFFEXOR) 75 MG tablet Take 150 mg by mouth 2 (two) times daily.  (Patient not taking: Reported on 05/02/2020)   Not Taking at Unknown time     Allergies  Allergen Reactions   Contrast Media [Iodinated Diagnostic Agents] Hives    Hives and sneezing even after premedication.    Bentyl [Dicyclomine] Other (See Comments)    Dizziness   Meloxicam Nausea And Vomiting   Metrizamide Hives   Sulfa Antibiotics Itching     Past Medical History:  Diagnosis Date   Anxiety    Arthritis    B12 deficiency    Depression    Epilepsy ([place])    GERD (gastroesophageal reflux disease)    Headache    Hypercholesterolemia    Hyperlipidemia    Hypothyroidism    Hypothyroidism    IBS (irritable bowel syndrome)    Renal stones    Seizures ([place])  Shingles    Sleep apnea    Stress incontinence    Visual field loss     Review of systems:  Otherwise negative.    Physical Exam  Gen: Alert, oriented. Appears stated age.  HEENT: [place]/AT. PERRLA. Lungs: No respiratory distress Abd: soft, benign, no masses. BS+ Ext: No edema. Pulses 2+    Planned procedures: Proceed with EGD/colonoscopy. The patient understands the nature of the planned procedure, indications, risks, alternatives and potential complications including but not limited to bleeding, infection, perforation, damage to internal organs and possible oversedation/side effects from anesthesia. The patient agrees and gives consent to proceed.  Please refer to procedure notes for findings,  recommendations and patient disposition/instructions.     Raylene Miyamoto MD, MPH Gastroenterology 05/02/2020  1:04 PM

## 2020-05-02 NOTE — Anesthesia Procedure Notes (Signed)
Performed by: Cook-Martin, Freddie Nghiem Pre-anesthesia Checklist: Patient identified, Emergency Drugs available, Suction available, Patient being monitored and Timeout performed Patient Re-evaluated:Patient Re-evaluated prior to induction Oxygen Delivery Method: Nasal cannula Preoxygenation: Pre-oxygenation with 100% oxygen Induction Type: IV induction Airway Equipment and Method: Bite block Placement Confirmation: CO2 detector and positive ETCO2       

## 2020-05-02 NOTE — Op Note (Signed)
[facility] Gastroenterology Patient Name: Morgan Good Procedure Date: 05/02/2020 1:03 PM MRN: [id number] Account #: [id number] Date of Birth: 01-13-1951 Admit Type: Outpatient Age: 69 Room: [facility] ENDO ROOM 2 Gender: Female Note Status: Finalized Procedure:             Upper GI endoscopy Indications:           Dyspepsia Providers:             Andrey Farmer MD, MD Medicines:             Monitored Anesthesia Care Complications:         No immediate complications. Estimated blood loss:                         Minimal. Procedure:             Pre-Anesthesia Assessment:                        - Prior to the procedure, a History and Physical was                         performed, and patient medications and allergies were                         reviewed. The patient is competent. The risks and                         benefits of the procedure and the sedation options and                         risks were discussed with the patient. All questions                         were answered and informed consent was obtained.                         Patient identification and proposed procedure were                         verified by the physician, the nurse, the anesthetist                         and the technician in the endoscopy suite. Mental                         Status Examination: alert and oriented. Airway                         Examination: normal oropharyngeal airway and neck                         mobility. Respiratory Examination: clear to                         auscultation. CV Examination: normal. Prophylactic                         Antibiotics: The patient does not require prophylactic  antibiotics. Prior Anticoagulants: The patient has                         taken no previous anticoagulant or antiplatelet                         agents. ASA Grade Assessment: II - A patient with mild                         systemic disease.  After reviewing the risks and                         benefits, the patient was deemed in satisfactory                         condition to undergo the procedure. The anesthesia                         plan was to use monitored anesthesia care (MAC).                         Immediately prior to administration of medications,                         the patient was re-assessed for adequacy to receive                         sedatives. The heart rate, respiratory rate, oxygen                         saturations, blood pressure, adequacy of pulmonary                         ventilation, and response to care were monitored                         throughout the procedure. The physical status of the                         patient was re-assessed after the procedure.                        After obtaining informed consent, the endoscope was                         passed under direct vision. Throughout the procedure,                         the patient's blood pressure, pulse, and oxygen                         saturations were monitored continuously. The Endoscope                         was introduced through the mouth, and advanced to the                         second part of duodenum. The upper GI endoscopy was  accomplished without difficulty. The patient tolerated                         the procedure well. Findings:      The examined esophagus was normal.      The entire examined stomach was normal. Biopsies were taken with a cold       forceps for Helicobacter pylori testing. Estimated blood loss was       minimal.      The examined duodenum was normal. Impression:            - Normal esophagus.                        - Normal stomach. Biopsied.                        - Normal examined duodenum. Recommendation:        - Discharge patient to home.                        - Resume previous diet.                        - Continue present medications.                         - Await pathology results.                        - Return to referring physician as previously                         scheduled. Procedure Code(s):     --- Professional ---                        [phone], Esophagogastroduodenoscopy, flexible,                         transoral; with biopsy, single or multiple Diagnosis Code(s):     --- Professional ---                        R10.13, Epigastric pain CPT copyright 2019 American Medical Association. All rights reserved. The codes documented in this report are preliminary and upon coder review may  be revised to meet current compliance requirements. Andrey Farmer, MD Andrey Farmer MD, MD 05/02/2020 1:58:42 PM Number of Addenda: 0 Note Initiated On: 05/02/2020 1:03 PM Estimated Blood Loss:  Estimated blood loss was minimal.      [facility]

## 2020-05-03 ENCOUNTER — Encounter: Payer: Self-pay | Admitting: Gastroenterology

## 2020-05-03 LAB — SURGICAL PATHOLOGY

## 2020-05-04 ENCOUNTER — Ambulatory Visit: Payer: PPO | Admitting: Physical Therapy

## 2020-05-09 ENCOUNTER — Other Ambulatory Visit: Payer: Self-pay

## 2020-05-09 ENCOUNTER — Ambulatory Visit: Payer: PPO | Attending: Neurology | Admitting: Physical Therapy

## 2020-05-09 ENCOUNTER — Encounter: Payer: Self-pay | Admitting: Physical Therapy

## 2020-05-09 DIAGNOSIS — R269 Unspecified abnormalities of gait and mobility: Secondary | ICD-10-CM

## 2020-05-09 DIAGNOSIS — R2689 Other abnormalities of gait and mobility: Secondary | ICD-10-CM

## 2020-05-09 DIAGNOSIS — R2681 Unsteadiness on feet: Secondary | ICD-10-CM

## 2020-05-09 DIAGNOSIS — R531 Weakness: Secondary | ICD-10-CM

## 2020-05-09 NOTE — Therapy (Signed)
[facility] [address] [place], [place], [address] Phone: [phone]   Fax:  [phone]  Physical Therapy Treatment  Patient Details  Name: Morgan Good MRN: [id number] Date of Birth: 09/08/1950 Referring Provider (PT): Dr. Jennings Books   Encounter Date: 05/09/2020   PT End of Session - 05/09/20 0947    Visit Number 5    Number of Visits 9    Date for PT Re-Evaluation 05/11/20    Authorization - Visit Number 5    Authorization - Number of Visits 10    PT Start Time 0946    PT Stop Time 1030    PT Time Calculation (min) 44 min    Equipment Utilized During Treatment Gait belt    Activity Tolerance Patient tolerated treatment well    Behavior During Therapy [facility] for tasks assessed/performed           Past Medical History:  Diagnosis Date  . Anxiety   . Arthritis   . B12 deficiency   . Depression   . Epilepsy ([place])   . GERD (gastroesophageal reflux disease)   . Headache   . Hypercholesterolemia   . Hyperlipidemia   . Hypothyroidism   . Hypothyroidism   . IBS (irritable bowel syndrome)   . Renal stones   . Seizures ([place])   . Shingles   . Sleep apnea   . Stress incontinence   . Visual field loss     Past Surgical History:  Procedure Laterality Date  . CARPAL TUNNEL RELEASE Left   . COLONOSCOPY WITH PROPOFOL N/A 08/22/2015   Procedure: COLONOSCOPY WITH PROPOFOL;  Surgeon: Manya Silvas, MD;  Location: [facility] ENDOSCOPY;  Service: Endoscopy;  Laterality: N/A;  . COLONOSCOPY WITH PROPOFOL N/A 05/02/2020   Procedure: COLONOSCOPY WITH PROPOFOL;  Surgeon: Lesly Rubenstein, MD;  Location: [facility] ENDOSCOPY;  Service: Endoscopy;  Laterality: N/A;  . ESOPHAGOGASTRODUODENOSCOPY (EGD) WITH PROPOFOL N/A 08/22/2015   Procedure: ESOPHAGOGASTRODUODENOSCOPY (EGD) WITH PROPOFOL;  Surgeon: Manya Silvas, MD;  Location: [facility] ENDOSCOPY;  Service: Endoscopy;  Laterality: N/A;  . ESOPHAGOGASTRODUODENOSCOPY (EGD) WITH PROPOFOL N/A  05/02/2020   Procedure: ESOPHAGOGASTRODUODENOSCOPY (EGD) WITH PROPOFOL;  Surgeon: Lesly Rubenstein, MD;  Location: [facility] ENDOSCOPY;  Service: Endoscopy;  Laterality: N/A;  . TONSILLECTOMY      There were no vitals filed for this visit.   Subjective Assessment - 05/09/20 0946    Subjective Pt. states that she is still tired from the procedure (colonoscopy) last week. No pain, no falls, no near falls.    Pertinent History pt enjoys antique shopping and thrift shopping, has 6 grandchildren that she very much enjoys babysitting. Pt. is retired.    Patient Stated Goals more mobility, wants to return to walking and exercising    Currently in Pain? No/denies              Neuro re-ed:  Standing balance airex: Pt demonstrates significant postural sway when standing on airex. Verbal cueing to feel where her weight is on her feet and to maintain that throughout standing. Pt progressed to eyes closed and practicing one handed hair washing simultaneously, required CGA to complete. Pt able to stand without significant postural sway with eyes closed and one handed hair washing by end of activity.   Stair training: cueing to stay upright when descending and to only look at feet with head, not fully leaning over to see. Pt completed 5x ascending/descendign stairs with improved balance, required cueing only on first  2 reps of stairs to complete.   Uneven terrain (blue mat with ankle weights underneath): pt able to walk over it at self selected speed, progressed to faster speed. Pt instructed pt then in "red light, green light" game. Pt able to stop in standing or in split stance, with no LOB noted. Pt required CGA throughout activity.   Standing perturbations in // bars: pt standing and multiple multidirectional perturbations applied to pt's shoulders. Pt progressed to perturbations with eyes closed. Pt able to use ankle, hip, and stepping strategies to complete activity. Pt required SBA to complete.    Walking over cones on floor spread at random in // bars:: PT spread cones on floor in a random pattern, instructed pt to find a path to walk through. Pt required cueing to maintain upright posture and not stare at her feet. Pt progressed to dual tasking walking over cones, demonstrated no LOB. PT provided SBA.   Single leg cone taps in // bars: Pt demonstrated decreased balance when completing single leg activity. Pt required moderate verbal cues to slow down, plan activity, maintain upright posture, and complete intentional weight shift towards stance leg. Pt completed 8x, improved control by last few reps.        PT Long Term Goals - 04/13/20 1203      PT LONG TERM GOAL #1   Title Pt. will improve FOTO to 63 to improve pain free functional mobility.    Baseline initial: 52    Time 4    Period Weeks    Status New    Target Date 05/11/20      PT LONG TERM GOAL #2   Title Pt will improve Berg to 50/56 to improve balance and decrease falls risk.    Baseline initial: 40/56    Time 4    Period Weeks    Status New    Target Date 05/11/20      PT LONG TERM GOAL #3   Title Pt. will improve LE strength bilat by at least 1/2 muscle grade    Baseline R/L (out of 5) hip flex: 3/3+; hip add: 4/4; hip abd: 4/4; knee ext: 3+/4; knee flex:4 /4+; ankle plantarflex: 5/5; ankle dorsiflex: 4/4    Time 4    Period Weeks    Status New    Target Date 05/11/20      PT LONG TERM GOAL #4   Title Pt. will report maintaining balance when picking object up off floor to improve functional mobility and decrease falls risk.    Baseline initial: pt reports losing balance when picking objects up off floor    Time 4    Period Weeks    Status New    Target Date 05/11/20                 Plan - 05/09/20 1054    Clinical Impression Statement Pt. returns to therapy after a week off. Pt. continues to demonstrate good carryover from prior sessions with ability to stand without significant postural  sway or posterior weightshift, and able to demonstrate stepping strategies when necessary. Pt. practiced stairs today with cueing to maintain upright posture when descending stairs, pt felt more comfortable and steady with this modificaiton. Pt. continues to demonstrate improved balance during dynamic and static activities with cueing to feel where her weight is across her feet. Pt. will continue to benefit from skilled PT to improve balance and strength to decrease falls risk.    Personal Factors and Comorbidities  Comorbidity 1    Comorbidities seizure disorder    Stability/Clinical Decision Making Evolving/Moderate complexity    Clinical Decision Making Moderate    Rehab Potential Good    PT Frequency 2x / week    PT Duration 4 weeks    PT Treatment/Interventions ADLs/Self Care Home Management;Gait training;Stair training;Functional mobility training;Therapeutic activities;Therapeutic exercise;Neuromuscular re-education;Balance training;Patient/family education;Vestibular    PT Next Visit Plan cardio education for home bike    Consulted and Agree with Plan of Care Patient           Patient will benefit from skilled therapeutic intervention in order to improve the following deficits and impairments:  Abnormal gait, Decreased balance, Decreased coordination, Decreased safety awareness, Decreased strength, Impaired sensation, Decreased mobility  Visit Diagnosis: Imbalance  Unsteadiness on feet  Gait abnormality  Decreased strength     Problem List There are no problems to display for this patient.  Pura Spice, PT, DPT # [id number], SPT 05/09/2020, 11:27 AM  [facility] [address] [place], [place], [address] Phone: [phone]   Fax:  [phone]  Name: Morgan Good MRN: [id number] Date of Birth: August 12, 1950

## 2020-05-10 DIAGNOSIS — H40053 Ocular hypertension, bilateral: Secondary | ICD-10-CM | POA: Diagnosis not present

## 2020-05-11 ENCOUNTER — Ambulatory Visit: Payer: PPO | Admitting: Physical Therapy

## 2020-05-11 ENCOUNTER — Encounter: Payer: Self-pay | Admitting: Physical Therapy

## 2020-05-11 ENCOUNTER — Other Ambulatory Visit: Payer: Self-pay

## 2020-05-11 DIAGNOSIS — R531 Weakness: Secondary | ICD-10-CM

## 2020-05-11 DIAGNOSIS — R2689 Other abnormalities of gait and mobility: Secondary | ICD-10-CM

## 2020-05-11 DIAGNOSIS — R269 Unspecified abnormalities of gait and mobility: Secondary | ICD-10-CM

## 2020-05-11 DIAGNOSIS — R2681 Unsteadiness on feet: Secondary | ICD-10-CM

## 2020-05-11 NOTE — Therapy (Signed)
[facility] [address] [place], [place], [address] Phone: [phone]   Fax:  [phone]  Physical Therapy Treatment  Patient Details  Name: Morgan Good MRN: [id number] Date of Birth: [birth year] Referring Provider (PT): Dr. Jennings Books   Encounter Date: 05/11/2020   PT End of Session - 05/11/20 0949    Visit Number 6    Number of Visits 9    Date for PT Re-Evaluation 05/11/20    Authorization - Visit Number 6    Authorization - Number of Visits 10    PT Start Time 2751    PT Stop Time 1035    PT Time Calculation (min) 48 min    Equipment Utilized During Treatment Gait belt    Activity Tolerance Patient tolerated treatment well    Behavior During Therapy [facility] for tasks assessed/performed           Past Medical History:  Diagnosis Date   Anxiety    Arthritis    B12 deficiency    Depression    Epilepsy ([place])    GERD (gastroesophageal reflux disease)    Headache    Hypercholesterolemia    Hyperlipidemia    Hypothyroidism    Hypothyroidism    IBS (irritable bowel syndrome)    Renal stones    Seizures ([place])    Shingles    Sleep apnea    Stress incontinence    Visual field loss     Past Surgical History:  Procedure Laterality Date   CARPAL TUNNEL RELEASE Left    COLONOSCOPY WITH PROPOFOL N/A 08/22/2015   Procedure: COLONOSCOPY WITH PROPOFOL;  Surgeon: Manya Silvas, MD;  Location: [place];  Service: Endoscopy;  Laterality: N/A;   COLONOSCOPY WITH PROPOFOL N/A 05/02/2020   Procedure: COLONOSCOPY WITH PROPOFOL;  Surgeon: Lesly Rubenstein, MD;  Location: [facility] ENDOSCOPY;  Service: Endoscopy;  Laterality: N/A;   ESOPHAGOGASTRODUODENOSCOPY (EGD) WITH PROPOFOL N/A 08/22/2015   Procedure: ESOPHAGOGASTRODUODENOSCOPY (EGD) WITH PROPOFOL;  Surgeon: Manya Silvas, MD;  Location: [facility] ENDOSCOPY;  Service: Endoscopy;  Laterality: N/A;   ESOPHAGOGASTRODUODENOSCOPY (EGD) WITH PROPOFOL N/A  05/02/2020   Procedure: ESOPHAGOGASTRODUODENOSCOPY (EGD) WITH PROPOFOL;  Surgeon: Lesly Rubenstein, MD;  Location: [facility] ENDOSCOPY;  Service: Endoscopy;  Laterality: N/A;   TONSILLECTOMY      There were no vitals filed for this visit.   Subjective Assessment - 05/11/20 0948    Subjective Pt. reports that her balance has been feeling good. Pt. reports that her husband has noticed that her balance appears better.    Pertinent History pt enjoys antique shopping and thrift shopping, has 6 grandchildren that she very much enjoys babysitting. Pt. is retired.    Patient Stated Goals more mobility, wants to return to walking and exercising    Currently in Pain? No/denies             Neuro Re-ed:  Outdoor walking: walking on uneven surfaces, curb training, stepping on tree roots. Pt walking on uneven ground with inclines and uneven terrain, instructed to relax arms and focus on where she feels the pressure in her feet. Pt able to correct all LOB, required CGA throughout. 15 mins  Walking on obstacles in gym: pt walking over multiple unstable surfaces in no specific pattern. Pt instructed to walk over items at random to improve proprioception of feet and reaction to unexpected perturbations. Pt able to self correct all LOB, required CGA throughout.   Bosu in // bars: pt initially instructed to  stand on bosu. Pt able to stand with eyes open and maintain balance. Pt then instructed to close eyes, pt experienced 1x LOB that she was able to correct with use of // bar. Pt then instructed to walk in // bars and step over it, pt able to complete activity with no LOB.   Picking up cones off ground and low surface: pt instructed to improve hip hinge to pick up cones to maintain balance. Pt repots feeling more balanced with improving hip hinge.        PT Long Term Goals - 04/13/20 1203      PT LONG TERM GOAL #1   Title Pt. will improve FOTO to 63 to improve pain free functional mobility.     Baseline initial: 52    Time 4    Period Weeks    Status New    Target Date 05/11/20      PT LONG TERM GOAL #2   Title Pt will improve Berg to 50/56 to improve balance and decrease falls risk.    Baseline initial: 40/56    Time 4    Period Weeks    Status New    Target Date 05/11/20      PT LONG TERM GOAL #3   Title Pt. will improve LE strength bilat by at least 1/2 muscle grade    Baseline R/L (out of 5) hip flex: 3/3+; hip add: 4/4; hip abd: 4/4; knee ext: 3+/4; knee flex:4 /4+; ankle plantarflex: 5/5; ankle dorsiflex: 4/4    Time 4    Period Weeks    Status New    Target Date 05/11/20      PT LONG TERM GOAL #4   Title Pt. will report maintaining balance when picking object up off floor to improve functional mobility and decrease falls risk.    Baseline initial: pt reports losing balance when picking objects up off floor    Time 4    Period Weeks    Status New    Target Date 05/11/20                 Plan - 05/11/20 1059    Clinical Impression Statement Pt. demonstrated improved ability to maintain balance during dynamic activities. Pt. completed various dynamic balance activities with unexpected perturbations, pt was instructed to focus on feeling where her feet are and how she needs to respond to maintain balance. Pt. continues to demonstrate ankle, hip, and stepping strategy to maintain balance throughout session. Pt. will continue to benefit from skilled PT to improve balance to decrease falls risk and improve independence with ADLs.    Personal Factors and Comorbidities Comorbidity 1    Comorbidities seizure disorder    Stability/Clinical Decision Making Evolving/Moderate complexity    Clinical Decision Making Moderate    Rehab Potential Good    PT Frequency 2x / week    PT Duration 4 weeks    PT Treatment/Interventions ADLs/Self Care Home Management;Gait training;Stair training;Functional mobility training;Therapeutic activities;Therapeutic  exercise;Neuromuscular re-education;Balance training;Patient/family education;Vestibular    PT Next Visit Plan RECERT next tx.    Consulted and Agree with Plan of Care Patient           Patient will benefit from skilled therapeutic intervention in order to improve the following deficits and impairments:  Abnormal gait, Decreased balance, Decreased coordination, Decreased safety awareness, Decreased strength, Impaired sensation, Decreased mobility  Visit Diagnosis: Imbalance  Unsteadiness on feet  Gait abnormality  Decreased strength     Problem  List There are no problems to display for this patient.   Pura Spice, PT, DPT # [id number], SPT 05/11/2020, 12:43 PM  [facility] [address] [place], [place], [address] Phone: [phone]   Fax:  [phone]  Name: Morgan Good MRN: [id number] Date of Birth: 04-25-1951

## 2020-05-12 DIAGNOSIS — R41840 Attention and concentration deficit: Secondary | ICD-10-CM | POA: Diagnosis not present

## 2020-05-12 DIAGNOSIS — F411 Generalized anxiety disorder: Secondary | ICD-10-CM | POA: Diagnosis not present

## 2020-05-12 DIAGNOSIS — F5105 Insomnia due to other mental disorder: Secondary | ICD-10-CM | POA: Diagnosis not present

## 2020-05-12 DIAGNOSIS — F332 Major depressive disorder, recurrent severe without psychotic features: Secondary | ICD-10-CM | POA: Diagnosis not present

## 2020-05-16 ENCOUNTER — Other Ambulatory Visit: Payer: Self-pay

## 2020-05-16 ENCOUNTER — Encounter: Payer: Self-pay | Admitting: Physical Therapy

## 2020-05-16 ENCOUNTER — Ambulatory Visit: Payer: PPO | Admitting: Physical Therapy

## 2020-05-16 DIAGNOSIS — R2681 Unsteadiness on feet: Secondary | ICD-10-CM

## 2020-05-16 DIAGNOSIS — R531 Weakness: Secondary | ICD-10-CM

## 2020-05-16 DIAGNOSIS — R2689 Other abnormalities of gait and mobility: Secondary | ICD-10-CM | POA: Diagnosis not present

## 2020-05-16 DIAGNOSIS — R269 Unspecified abnormalities of gait and mobility: Secondary | ICD-10-CM

## 2020-05-16 NOTE — Therapy (Signed)
[facility] [address] [place], [place], [address] Phone: [phone]   Fax:  [phone]  Physical Therapy Treatment and Recert Dates: 7/0/2637-85/88/5027  Patient Details  Name: Morgan Good MRN: [id number] Date of Birth: 1951/03/31 Referring Provider (PT): Dr. Jennings Books   Encounter Date: 05/16/2020   PT End of Session - 05/16/20 0943    Visit Number 7    Number of Visits 15    Date for PT Re-Evaluation 06/13/20    Authorization - Visit Number 1    Authorization - Number of Visits 10    PT Start Time [phone]    PT Stop Time 1032    PT Time Calculation (min) 50 min    Equipment Utilized During Treatment Gait belt    Activity Tolerance Patient tolerated treatment well    Behavior During Therapy [facility] for tasks assessed/performed           Past Medical History:  Diagnosis Date  . Anxiety   . Arthritis   . B12 deficiency   . Depression   . Epilepsy ([place])   . GERD (gastroesophageal reflux disease)   . Headache   . Hypercholesterolemia   . Hyperlipidemia   . Hypothyroidism   . Hypothyroidism   . IBS (irritable bowel syndrome)   . Renal stones   . Seizures ([place])   . Shingles   . Sleep apnea   . Stress incontinence   . Visual field loss     Past Surgical History:  Procedure Laterality Date  . CARPAL TUNNEL RELEASE Left   . COLONOSCOPY WITH PROPOFOL N/A 08/22/2015   Procedure: COLONOSCOPY WITH PROPOFOL;  Surgeon: Manya Silvas, MD;  Location: [facility] ENDOSCOPY;  Service: Endoscopy;  Laterality: N/A;  . COLONOSCOPY WITH PROPOFOL N/A 05/02/2020   Procedure: COLONOSCOPY WITH PROPOFOL;  Surgeon: Lesly Rubenstein, MD;  Location: [facility] ENDOSCOPY;  Service: Endoscopy;  Laterality: N/A;  . ESOPHAGOGASTRODUODENOSCOPY (EGD) WITH PROPOFOL N/A 08/22/2015   Procedure: ESOPHAGOGASTRODUODENOSCOPY (EGD) WITH PROPOFOL;  Surgeon: Manya Silvas, MD;  Location: [facility] ENDOSCOPY;  Service: Endoscopy;  Laterality: N/A;  .  ESOPHAGOGASTRODUODENOSCOPY (EGD) WITH PROPOFOL N/A 05/02/2020   Procedure: ESOPHAGOGASTRODUODENOSCOPY (EGD) WITH PROPOFOL;  Surgeon: Lesly Rubenstein, MD;  Location: [facility] ENDOSCOPY;  Service: Endoscopy;  Laterality: N/A;  . TONSILLECTOMY      There were no vitals filed for this visit.   Subjective Assessment - 05/17/20 0810    Subjective Pt. states she is doing well and states stairs are easier.  No reports of pain or recent falls.    Pertinent History pt enjoys antique shopping and thrift shopping, has 6 grandchildren that she very much enjoys babysitting. Pt. is retired.    Patient Stated Goals more mobility, wants to return to walking and exercising         Neuro Re-ed:  Merrilee Jansky reassessment: 55/56. Pt required SBA and supervision throughout activities.   DGI assessment: 19/24. Pt required CGA throughout activities and demonstrated obvious LOB with walking paired with vertical or horizontal head turns.   Walking in hallway with horizontal and vertical head turn practice: pt in hallway instructed to walk slowly and pick something at the end of the hallway to focus on between head turns, and told to intentionally look left or right at the letters/numbers posted on the wall. Pt instructed to look at the number/letter on the wall, then back to the object she picked to focus on at end of hallway. With intentional focus, pt was  better able to maintain balance while walking.   Seated head turn practice: pt instructed to initially pick 3 objects in the room while sitting, one to the left of her, in front of her, and to the right of her. Pt instructed to begin turning head and to focus on those objects as she turned her head.      [facility] PT Assessment - 05/17/20 0001      Assessment   Medical Diagnosis imbalance    Referring Provider (PT) Dr. Jennings Books    Onset Date/Surgical Date 08/07/19      Prior Function   Level of Independence Independent             PT Long Term Goals -  05/16/20 0946      PT LONG TERM GOAL #1   Title Pt. will improve FOTO to 63 to improve pain free functional mobility.    Baseline initial: 52, 10/11: 59    Time 4    Period Weeks    Status Partially Met    Target Date 06/13/20      PT LONG TERM GOAL #2   Title Pt will improve Berg to 50/56 to improve balance and decrease falls risk.    Baseline initial: 40/56. 10/11: 55/56    Time 4    Period Weeks    Status Achieved    Target Date 05/16/20      PT LONG TERM GOAL #3   Title Pt. will improve LE strength bilat by at least 1/2 muscle grade    Baseline R/L (out of 5) hip flex: 3/3+; hip add: 4/4; hip abd: 4/4; knee ext: 3+/4; knee flex:4 /4+; ankle plantarflex: 5/5; ankle dorsiflex: 4/4; 10/11: R/L (out of 5) hip flex: 4/4; hip add: 5/5; hip abd: 5/5; knee ext: 4+/4; knee flex: 5/4+; ankle plantarflex: 5/5; ankle dorsiflex: 4/4    Time 4    Period Weeks    Status Partially Met    Target Date 06/13/20      PT LONG TERM GOAL #4   Title Pt. will report maintaining balance when picking object up off floor to improve functional mobility and decrease falls risk.    Baseline initial: pt reports losing balance when picking objects up off floor. 10/11: pt is now able pick things up off the floor without losing balance.    Time 4    Period Weeks    Status Achieved    Target Date 05/16/20      PT LONG TERM GOAL #5   Title Pt. will be score at least 22/24 on DGI to decrease falls risk while walking and improve safety.    Baseline 10/11: 19/24    Time 4    Period Weeks    Status New    Target Date 06/13/20                 Plan - 05/16/20 1037    Clinical Impression Statement PT re-assessed goals today. Pt. has achieved most of her strength goal and scored 55/56 on the Berg Balance scale. Pt. has scored 59/63 on FOTO. Pt. completed DGI today, scored 19/24, new goal added to improve score. Pt. continues to demonstrate good carryover with static balance and gait, continues to  demonstrate difficulty with gait and head turns. Pt. describes still experiencing difficulty with walking outdoors. Pt. completed walking with head turns in hallway by slowly walking and intentionally picking things on the wall and at the end of the hallway to  focus on during walking. Pt. will continue to benefit from skilled PT to improve dynamic balance to improve independence with ADLs and decrease falls risk.    Personal Factors and Comorbidities Comorbidity 1    Comorbidities seizure disorder    Stability/Clinical Decision Making Evolving/Moderate complexity    Clinical Decision Making Moderate    Rehab Potential Good    PT Frequency 2x / week    PT Duration 4 weeks    PT Treatment/Interventions ADLs/Self Care Home Management;Gait training;Stair training;Functional mobility training;Therapeutic activities;Therapeutic exercise;Neuromuscular re-education;Balance training;Patient/family education;Vestibular    PT Next Visit Plan Gait with head turns/ dynamic tasks.    Consulted and Agree with Plan of Care Patient           Patient will benefit from skilled therapeutic intervention in order to improve the following deficits and impairments:  Abnormal gait, Decreased balance, Decreased coordination, Decreased safety awareness, Decreased strength, Impaired sensation, Decreased mobility  Visit Diagnosis: Imbalance  Unsteadiness on feet  Gait abnormality  Decreased strength     Problem List There are no problems to display for this patient.  Pura Spice, PT, DPT # [id number] Carlyle Basques, SPT 05/17/2020, 8:13 AM  [facility] [address] [place], [place], [address] Phone: [phone]   Fax:  [phone]  Name: Morgan Good MRN: [id number] Date of Birth: April 08, 1951

## 2020-05-18 ENCOUNTER — Other Ambulatory Visit: Payer: Self-pay

## 2020-05-18 ENCOUNTER — Ambulatory Visit: Payer: PPO

## 2020-05-18 ENCOUNTER — Encounter: Payer: Self-pay | Admitting: Physical Therapy

## 2020-05-18 DIAGNOSIS — R2681 Unsteadiness on feet: Secondary | ICD-10-CM

## 2020-05-18 DIAGNOSIS — R531 Weakness: Secondary | ICD-10-CM

## 2020-05-18 DIAGNOSIS — R2689 Other abnormalities of gait and mobility: Secondary | ICD-10-CM | POA: Diagnosis not present

## 2020-05-18 DIAGNOSIS — R269 Unspecified abnormalities of gait and mobility: Secondary | ICD-10-CM

## 2020-05-18 NOTE — Therapy (Signed)
[facility] [address] [place], [place], [address] Phone: [phone]   Fax:  [phone]  Physical Therapy Treatment  Patient Details  Name: Morgan Good MRN: [id number] Date of Birth: May 22, 1951 Referring Provider (PT): Dr. Jennings Books   Encounter Date: 05/18/2020   PT End of Session - 05/18/20 0952    Visit Number 8    Number of Visits 15    Date for PT Re-Evaluation 06/13/20    Authorization - Visit Number 2    Authorization - Number of Visits 10    PT Start Time 0947    PT Stop Time 1028    PT Time Calculation (min) 41 min    Equipment Utilized During Treatment Gait belt    Activity Tolerance Patient tolerated treatment well    Behavior During Therapy [facility] for tasks assessed/performed           Past Medical History:  Diagnosis Date  . Anxiety   . Arthritis   . B12 deficiency   . Depression   . Epilepsy ([place])   . GERD (gastroesophageal reflux disease)   . Headache   . Hypercholesterolemia   . Hyperlipidemia   . Hypothyroidism   . Hypothyroidism   . IBS (irritable bowel syndrome)   . Renal stones   . Seizures ([place])   . Shingles   . Sleep apnea   . Stress incontinence   . Visual field loss     Past Surgical History:  Procedure Laterality Date  . CARPAL TUNNEL RELEASE Left   . COLONOSCOPY WITH PROPOFOL N/A 08/22/2015   Procedure: COLONOSCOPY WITH PROPOFOL;  Surgeon: Manya Silvas, MD;  Location: [facility] ENDOSCOPY;  Service: Endoscopy;  Laterality: N/A;  . COLONOSCOPY WITH PROPOFOL N/A 05/02/2020   Procedure: COLONOSCOPY WITH PROPOFOL;  Surgeon: Lesly Rubenstein, MD;  Location: [facility] ENDOSCOPY;  Service: Endoscopy;  Laterality: N/A;  . ESOPHAGOGASTRODUODENOSCOPY (EGD) WITH PROPOFOL N/A 08/22/2015   Procedure: ESOPHAGOGASTRODUODENOSCOPY (EGD) WITH PROPOFOL;  Surgeon: Manya Silvas, MD;  Location: [facility] ENDOSCOPY;  Service: Endoscopy;  Laterality: N/A;  . ESOPHAGOGASTRODUODENOSCOPY (EGD) WITH PROPOFOL N/A  05/02/2020   Procedure: ESOPHAGOGASTRODUODENOSCOPY (EGD) WITH PROPOFOL;  Surgeon: Lesly Rubenstein, MD;  Location: [facility] ENDOSCOPY;  Service: Endoscopy;  Laterality: N/A;  . TONSILLECTOMY      There were no vitals filed for this visit.   Subjective Assessment - 05/18/20 0951    Subjective Pt. states that she is doing well, balance has been feeling good. Pt. states she feels that when she's walking fast she still feels wobbly. No falls or near falls reported. No pain.    Pertinent History pt enjoys antique shopping and thrift shopping, has 6 grandchildren that she very much enjoys babysitting. Pt. is retired.    Patient Stated Goals more mobility, wants to return to walking and exercising    Currently in Pain? No/denies            Neuro re-ed:   Head turn with visual focus activity: pt instructed to find 3 things: one to the left of her, one in front, and one to the right. Pt initially sitting, told to look left and right, and look at the one in the middle. Pt able to track smoothly by the end. Pt transitioned to standing with same activity, felt more unsteady but with cueing to slow down was able to track smoothly. 10 mins   Walking in hallway with head turns: cueing for finding multiple things to focus on  as she turned her head left and right. Progressed to faster gait speed while maintaining slower head turns. Completed approx 10x length of hallway   Walking outside over uneven terrain: pt instructed to walk over uneven terrain and walk over sloped terrain maintaining heel to toe gait pattern. Pt able to walk over uneven terrain with consistent gait pattern and no LOB, pt required no assistance.  Hurdles in // bars: pt able to complete three 6 inch hurdles 2x in // bars. Pt able to complete with no changes in gait speed. Pt given SBA by PT.                             PT Long Term Goals - 05/16/20 0946      PT LONG TERM GOAL #1   Title Pt. will improve FOTO  to 63 to improve pain free functional mobility.    Baseline initial: 52, 10/11: 59    Time 4    Period Weeks    Status Partially Met    Target Date 06/13/20      PT LONG TERM GOAL #2   Title Pt will improve Berg to 50/56 to improve balance and decrease falls risk.    Baseline initial: 40/56. 10/11: 55/56    Time 4    Period Weeks    Status Achieved    Target Date 05/16/20      PT LONG TERM GOAL #3   Title Pt. will improve LE strength bilat by at least 1/2 muscle grade    Baseline R/L (out of 5) hip flex: 3/3+; hip add: 4/4; hip abd: 4/4; knee ext: 3+/4; knee flex:4 /4+; ankle plantarflex: 5/5; ankle dorsiflex: 4/4; 10/11: R/L (out of 5) hip flex: 4/4; hip add: 5/5; hip abd: 5/5; knee ext: 4+/4; knee flex: 5/4+; ankle plantarflex: 5/5; ankle dorsiflex: 4/4    Time 4    Period Weeks    Status Partially Met    Target Date 06/13/20      PT LONG TERM GOAL #4   Title Pt. will report maintaining balance when picking object up off floor to improve functional mobility and decrease falls risk.    Baseline initial: pt reports losing balance when picking objects up off floor. 10/11: pt is now able pick things up off the floor without losing balance.    Time 4    Period Weeks    Status Achieved    Target Date 05/16/20      PT LONG TERM GOAL #5   Title Pt. will be score at least 22/24 on DGI to decrease falls risk while walking and improve safety.    Baseline 10/11: 19/24    Time 4    Period Weeks    Status New    Target Date 06/13/20                 Plan - 05/18/20 1037    Clinical Impression Statement Pt. completed several dynamic balance activities throughout session today. Pt. continues to demonstrate improvement in ability to maintain steady gait over uneven terrain and during walking with head turns. Pt. instructed on completing seated head turn exercises to improve smooth pursuit eye tracking. Pt. will continue to benefit from skilled PT to improve dynamic balance to improve  independence with ADLs and decrease falls risk.    Personal Factors and Comorbidities Comorbidity 1    Comorbidities seizure disorder    Stability/Clinical Decision Making Evolving/Moderate  complexity    Clinical Decision Making Moderate    Rehab Potential Good    PT Frequency 2x / week    PT Duration 4 weeks    PT Treatment/Interventions ADLs/Self Care Home Management;Gait training;Stair training;Functional mobility training;Therapeutic activities;Therapeutic exercise;Neuromuscular re-education;Balance training;Patient/family education;Vestibular    PT Next Visit Plan Gait with head turns/ dynamic tasks.    Consulted and Agree with Plan of Care Patient           Patient will benefit from skilled therapeutic intervention in order to improve the following deficits and impairments:  Abnormal gait, Decreased balance, Decreased coordination, Decreased safety awareness, Decreased strength, Impaired sensation, Decreased mobility  Visit Diagnosis: Imbalance  Unsteadiness on feet  Gait abnormality  Decreased strength     Problem List There are no problems to display for this patient.   Abby Zen Cedillos 05/18/2020, 10:44 AM  [facility] [address] [place], [place], [address] Phone: [phone]   Fax:  [phone]  Name: Morgan Good MRN: [id number] Date of Birth: 28-Jul-1951

## 2020-05-19 ENCOUNTER — Ambulatory Visit
Admission: RE | Admit: 2020-05-19 | Discharge: 2020-05-19 | Disposition: A | Discharge status: Home / Self Care | Payer: PPO | Source: Ambulatory Visit | Attending: Internal Medicine | Admitting: Internal Medicine

## 2020-05-19 ENCOUNTER — Encounter (INDEPENDENT_AMBULATORY_CARE_PROVIDER_SITE_OTHER): Payer: Self-pay

## 2020-05-19 DIAGNOSIS — Z1231 Encounter for screening mammogram for malignant neoplasm of breast: Secondary | ICD-10-CM | POA: Diagnosis not present

## 2020-05-23 ENCOUNTER — Ambulatory Visit: Payer: PPO

## 2020-05-23 ENCOUNTER — Encounter: Payer: Self-pay | Admitting: Physical Therapy

## 2020-05-23 ENCOUNTER — Other Ambulatory Visit: Payer: Self-pay

## 2020-05-23 DIAGNOSIS — R2689 Other abnormalities of gait and mobility: Secondary | ICD-10-CM

## 2020-05-23 DIAGNOSIS — R531 Weakness: Secondary | ICD-10-CM

## 2020-05-23 DIAGNOSIS — R2681 Unsteadiness on feet: Secondary | ICD-10-CM

## 2020-05-23 DIAGNOSIS — R269 Unspecified abnormalities of gait and mobility: Secondary | ICD-10-CM

## 2020-05-23 NOTE — Patient Instructions (Signed)
Access Code: [email or web address] URL: [email or web address] Date: 05/23/2020 Prepared by: Lieutenant Diego Exercises Tandem Stance with Eyes Closed in Corner - 1 x daily - 7 x weekly - 3 sets - 10 reps Tandem Stance with Head Rotation - 1 x daily - 7 x weekly - 3 sets - 10 reps Tandem Stance with Head Nods - 1 x daily - 7 x weekly - 3 sets - 10 reps Romberg Stance on Foam Pad with Head Rotation - 1 x daily - 7 x weekly - 3 sets - 10 reps Walking - 4 x weekly Romberg Stance - 1 x daily - 7 x weekly - 2 sets - 10 reps

## 2020-05-23 NOTE — Therapy (Signed)
[employer] [facility] [address]. [place], [place], [address] Phone: [phone]   Fax:  [phone]  Physical Therapy Treatment  Patient Details  Name: Morgan Good MRN: [id number] Date of Birth: 1951/03/06 Referring Provider (PT): Dr. Jennings Books   Encounter Date: 05/23/2020   PT End of Session - 05/23/20 0950    Visit Number 9    Number of Visits 15    Date for PT Re-Evaluation 06/13/20    Authorization - Visit Number 3    Authorization - Number of Visits 10    PT Start Time 0947    PT Stop Time 1030    PT Time Calculation (min) 43 min    Equipment Utilized During Treatment Gait belt    Activity Tolerance Patient tolerated treatment well    Behavior During Therapy [facility] for tasks assessed/performed           Past Medical History:  Diagnosis Date  . Anxiety   . Arthritis   . B12 deficiency   . Depression   . Epilepsy ([place])   . GERD (gastroesophageal reflux disease)   . Headache   . Hypercholesterolemia   . Hyperlipidemia   . Hypothyroidism   . Hypothyroidism   . IBS (irritable bowel syndrome)   . Renal stones   . Seizures ([place])   . Shingles   . Sleep apnea   . Stress incontinence   . Visual field loss     Past Surgical History:  Procedure Laterality Date  . CARPAL TUNNEL RELEASE Left   . COLONOSCOPY WITH PROPOFOL N/A 08/22/2015   Procedure: COLONOSCOPY WITH PROPOFOL;  Surgeon: Manya Silvas, MD;  Location: [facility] ENDOSCOPY;  Service: Endoscopy;  Laterality: N/A;  . COLONOSCOPY WITH PROPOFOL N/A 05/02/2020   Procedure: COLONOSCOPY WITH PROPOFOL;  Surgeon: Lesly Rubenstein, MD;  Location: [facility] ENDOSCOPY;  Service: Endoscopy;  Laterality: N/A;  . ESOPHAGOGASTRODUODENOSCOPY (EGD) WITH PROPOFOL N/A 08/22/2015   Procedure: ESOPHAGOGASTRODUODENOSCOPY (EGD) WITH PROPOFOL;  Surgeon: Manya Silvas, MD;  Location[facility] ENDOSCOPY;  Service: Endoscopy;  Laterality: N/A;  . ESOPHAGOGASTRODUODENOSCOPY (EGD) WITH PROPOFOL N/A  05/02/2020   Procedure: ESOPHAGOGASTRODUODENOSCOPY (EGD) WITH PROPOFOL;  Surgeon: Lesly Rubenstein, MD;  Location: [facility] ENDOSCOPY;  Service: Endoscopy;  Laterality: N/A;  . TONSILLECTOMY      There were no vitals filed for this visit.   Subjective Assessment - 05/23/20 0948    Subjective Pt. states that her weekend was stressful with a family emergency. Pt. states that her balance has been doing good, and was able to do a lot of walking over the weekend.    Pertinent History pt enjoys antique shopping and thrift shopping, has 6 grandchildren that she very much enjoys babysitting. Pt. is retired.    Patient Stated Goals more mobility, wants to return to walking and exercising    Currently in Pain? No/denies            Neuro Re-ed:   Standing tandem in // bars with head turns: pt demonstrates more sway when looking to the right, regardless of which foot is forward. Progressed to eyes closed tandem stance. 30s bouts of head turnsx4. Eyes closed for 10-15 sec at a time. Pt required SBA-CGA.  Standing airex with arm movements: pt standing on airex instructed to complete arm movements. Pt demonstrated posterior LOB when both arms overhead. Pt instructed to keep toes on pad, pt better able to maintain balance. 2-3 min stands on airex with arm movements. Pt required  CGA.   Standing airex activities: Pt required CGA for all activities  -head turns: pt required cueing to keep body still and isolate head turns 3x30 sec bouts  -body turns: pt instructed to complete thoracic rotation while maintaining head still, pt required mod cueing to keep head still. 3x30s  Education on new HEP: pt given new HEP including these exercises. Pt educated on how to safely complete exercises at home by standing in a corner to decrease risk of posterior fall.                               PT Long Term Goals - 05/16/20 0946      PT LONG TERM GOAL #1   Title Pt. will improve FOTO to 63 to  improve pain free functional mobility.    Baseline initial: 52, 10/11: 59    Time 4    Period Weeks    Status Partially Met    Target Date 06/13/20      PT LONG TERM GOAL #2   Title Pt will improve Berg to 50/56 to improve balance and decrease falls risk.    Baseline initial: 40/56. 10/11: 55/56    Time 4    Period Weeks    Status Achieved    Target Date 05/16/20      PT LONG TERM GOAL #3   Title Pt. will improve LE strength bilat by at least 1/2 muscle grade    Baseline R/L (out of 5) hip flex: 3/3+; hip add: 4/4; hip abd: 4/4; knee ext: 3+/4; knee flex:4 /4+; ankle plantarflex: 5/5; ankle dorsiflex: 4/4; 10/11: R/L (out of 5) hip flex: 4/4; hip add: 5/5; hip abd: 5/5; knee ext: 4+/4; knee flex: 5/4+; ankle plantarflex: 5/5; ankle dorsiflex: 4/4    Time 4    Period Weeks    Status Partially Met    Target Date 06/13/20      PT LONG TERM GOAL #4   Title Pt. will report maintaining balance when picking object up off floor to improve functional mobility and decrease falls risk.    Baseline initial: pt reports losing balance when picking objects up off floor. 10/11: pt is now able pick things up off the floor without losing balance.    Time 4    Period Weeks    Status Achieved    Target Date 05/16/20      PT LONG TERM GOAL #5   Title Pt. will be score at least 22/24 on DGI to decrease falls risk while walking and improve safety.    Baseline 10/11: 19/24    Time 4    Period Weeks    Status New    Target Date 06/13/20                 Plan - 05/23/20 1032    Clinical Impression Statement Pt. issued new balance HEP today. Pt. able to complete all exercises safely. Pt. demonstrated few posterior LOB when standing on airex with both arms overhead, but able to correct with cueing toes on airex pad. Pt. educated heavily on how to complete exercises safely at home by standing in a corener with back facing corner, in case of any posterior LOB. PT will reassess goals next session.  Pt. will continue to benefit from skilled PT to improve balance to decrease falls risk and improve indepdence with ADLs.    Personal Factors and Comorbidities Comorbidity 1  Comorbidities seizure disorder    Stability/Clinical Decision Making Evolving/Moderate complexity    Clinical Decision Making Moderate    Rehab Potential Good    PT Frequency 2x / week    PT Duration 4 weeks    PT Treatment/Interventions ADLs/Self Care Home Management;Gait training;Stair training;Functional mobility training;Therapeutic activities;Therapeutic exercise;Neuromuscular re-education;Balance training;Patient/family education;Vestibular    PT Next Visit Plan Gait with head turns/ dynamic tasks.    PT Home Exercise Plan [email or web address]    Consulted and Agree with Plan of Care Patient           Patient will benefit from skilled therapeutic intervention in order to improve the following deficits and impairments:  Abnormal gait, Decreased balance, Decreased coordination, Decreased safety awareness, Decreased strength, Impaired sensation, Decreased mobility  Visit Diagnosis: Imbalance  Unsteadiness on feet  Gait abnormality  Decreased strength     Problem List There are no problems to display for this patient.   Carlyle Basques, SPT 05/23/2020, 10:55 AM  [facility] [address]. [place], [place], [address] Phone: [phone]   Fax:  [phone]  Name: Morgan Good MRN: [id number] Date of Birth: [birth year]

## 2020-05-25 ENCOUNTER — Other Ambulatory Visit: Payer: Self-pay

## 2020-05-25 ENCOUNTER — Encounter: Payer: Self-pay | Admitting: Physical Therapy

## 2020-05-25 ENCOUNTER — Ambulatory Visit: Payer: PPO | Admitting: Physical Therapy

## 2020-05-25 DIAGNOSIS — R2689 Other abnormalities of gait and mobility: Secondary | ICD-10-CM

## 2020-05-25 DIAGNOSIS — R531 Weakness: Secondary | ICD-10-CM

## 2020-05-25 DIAGNOSIS — R2681 Unsteadiness on feet: Secondary | ICD-10-CM

## 2020-05-25 DIAGNOSIS — R269 Unspecified abnormalities of gait and mobility: Secondary | ICD-10-CM

## 2020-05-25 NOTE — Therapy (Signed)
[facility] [address]. [place], [place], [address] Phone: [phone]   Fax:  [phone]  Physical Therapy Treatment, Progress Note, and Discharge   Dates of reporting period 04/13/2020 to 05/25/2020   Patient Details  Name: Morgan Good MRN: [id number] Date of Birth: 11-Feb-1951 Referring Provider (PT): Dr. Jennings Books   Encounter Date: 05/25/2020   PT End of Session - 05/25/20 0948    Visit Number 10    Number of Visits 15    Date for PT Re-Evaluation 06/13/20    Authorization - Visit Number 4    Authorization - Number of Visits 10    PT Start Time 0945    PT Stop Time 1032    PT Time Calculation (min) 47 min    Equipment Utilized During Treatment Gait belt    Activity Tolerance Patient tolerated treatment well    Behavior During Therapy [facility] for tasks assessed/performed           Past Medical History:  Diagnosis Date   Anxiety    Arthritis    B12 deficiency    Depression    Epilepsy ([place])    GERD (gastroesophageal reflux disease)    Headache    Hypercholesterolemia    Hyperlipidemia    Hypothyroidism    Hypothyroidism    IBS (irritable bowel syndrome)    Renal stones    Seizures ([place])    Shingles    Sleep apnea    Stress incontinence    Visual field loss     Past Surgical History:  Procedure Laterality Date   CARPAL TUNNEL RELEASE Left    COLONOSCOPY WITH PROPOFOL N/A 08/22/2015   Procedure: COLONOSCOPY WITH PROPOFOL;  Surgeon: Manya Silvas, MD;  Location: [place];  Service: Endoscopy;  Laterality: N/A;   COLONOSCOPY WITH PROPOFOL N/A 05/02/2020   Procedure: COLONOSCOPY WITH PROPOFOL;  Surgeon: Lesly Rubenstein, MD;  Location: [facility] ENDOSCOPY;  Service: Endoscopy;  Laterality: N/A;   ESOPHAGOGASTRODUODENOSCOPY (EGD) WITH PROPOFOL N/A 08/22/2015   Procedure: ESOPHAGOGASTRODUODENOSCOPY (EGD) WITH PROPOFOL;  Surgeon: Manya Silvas, MD;  Location[facility] ENDOSCOPY;   Service: Endoscopy;  Laterality: N/A;   ESOPHAGOGASTRODUODENOSCOPY (EGD) WITH PROPOFOL N/A 05/02/2020   Procedure: ESOPHAGOGASTRODUODENOSCOPY (EGD) WITH PROPOFOL;  Surgeon: Lesly Rubenstein, MD;  Location: [facility] ENDOSCOPY;  Service: Endoscopy;  Laterality: N/A;   TONSILLECTOMY      There were no vitals filed for this visit.   Subjective Assessment - 05/25/20 0945    Subjective Pt. reports that balance has been feeling "pretty good." She reports she was able to go out and do shopping yesterday. Pt. reports having a headache this morning that has since resolved.    Pertinent History pt enjoys antique shopping and thrift shopping, has 6 grandchildren that she very much enjoys babysitting. Pt. is retired.    Patient Stated Goals more mobility, wants to return to walking and exercising             Neuro Re-ed:   Goal reassessment: FOTO: 77/63; strength testing: see updated goals for details, DGI reassessment: 22/24.   Walking turn and stop: pt practiced walking with turning and stopping. Initially pt felt slight LOB/dizziness when turning and demonstrated keeping more weight on heels throughout motion. With pt putting more weight on her toes, pt experienced less feeling of LOB/dizziness. Pt completed 5x walking and turns.   Posture re-ed: standing with mirror to side of pt, pt instructed with verbal and tactile cueing for more  forward shoulder positioning over her hips. Pt able to move out of correct posture and find it again with less tactile cues with each rep. Pt completed 5x reps  Heavy education provided on proper footwear and where to shop for shoes.          PT Long Term Goals - 05/25/20 0950      PT LONG TERM GOAL #1   Title Pt. will improve FOTO to 63 to improve pain free functional mobility.    Baseline initial: 52, 10/11: 59, 10/20: 77    Time 4    Period Weeks    Status Achieved    Target Date 05/25/20      PT LONG TERM GOAL #2   Title Pt will improve Berg to  50/56 to improve balance and decrease falls risk.    Baseline initial: 40/56. 10/11: 55/56    Time 4    Period Weeks    Status Achieved    Target Date 05/16/20      PT LONG TERM GOAL #3   Title Pt. will improve LE strength bilat by at least 1/2 muscle grade    Baseline R/L (out of 5) hip flex: 3/3+; hip add: 4/4; hip abd: 4/4; knee ext: 3+/4; knee flex:4 /4+; ankle plantarflex: 5/5; ankle dorsiflex: 4/4; 10/11: R/L (out of 5) hip flex: 4/4; hip add: 5/5; hip abd: 5/5; knee ext: 4+/4; knee flex: 5/4+; ankle plantarflex: 5/5; ankle dorsiflex: 4/4; 10/20: R/L (out of 5) hip flex: 5/4+, hip add and abd: 5/5, knee ext: 5/5, knee flex: 4+/5, plantarflex: 5/5, dorsiflex: 5/4+    Time 4    Period Weeks    Status Achieved    Target Date 05/25/20      PT LONG TERM GOAL #4   Title Pt. will report maintaining balance when picking object up off floor to improve functional mobility and decrease falls risk.    Baseline initial: pt reports losing balance when picking objects up off floor. 10/11: pt is now able pick things up off the floor without losing balance.    Time 4    Period Weeks    Status Achieved    Target Date 05/16/20      PT LONG TERM GOAL #5   Title Pt. will be score at least 22/24 on DGI to decrease falls risk while walking and improve safety.    Baseline 10/11: 19/24 10/20: 22/24    Time 4    Period Weeks    Status Achieved    Target Date 05/25/20                 Plan - 05/25/20 1107    Clinical Impression Statement Pt. discharged from PT today. Goals evaluated, pt has met all goals set for therapy at this time, see updated goals for details. Pt. able to better ambulate wihtout LOB, and if LOB occurs pt. demonstrates good ability to self correct using either ankle, hip, or stepping strategy. Pt. has demonstrated excellent ability to maintain static balance, even with reaching outside BOS. Pt. agreeable to discharge at this time with updated HEP from last session. Pt.  instructed to call clinic in case of any regression or more frequent LOB, pt verbalized understanding.    Personal Factors and Comorbidities Comorbidity 1    Comorbidities seizure disorder    Stability/Clinical Decision Making Evolving/Moderate complexity    Clinical Decision Making Moderate    Rehab Potential Good    PT Frequency 2x / week  PT Duration 4 weeks    PT Treatment/Interventions ADLs/Self Care Home Management;Gait training;Stair training;Functional mobility training;Therapeutic activities;Therapeutic exercise;Neuromuscular re-education;Balance training;Patient/family education;Vestibular    PT Next Visit Plan Discharge vist.  Pt. instructed to contact PT if any further issues or questions.    PT Home Exercise Plan [email or web address]    Consulted and Agree with Plan of Care Patient           Patient will benefit from skilled therapeutic intervention in order to improve the following deficits and impairments:  Abnormal gait, Decreased balance, Decreased coordination, Decreased safety awareness, Decreased strength, Impaired sensation, Decreased mobility  Visit Diagnosis: Imbalance  Unsteadiness on feet  Gait abnormality  Decreased strength     Problem List There are no problems to display for this patient.  Pura Spice, PT, DPT # [id number], SPT 05/25/2020, 2:37 PM  [facility] [address] [place], [place], [address] Phone: [phone]   Fax:  [phone]  Name: Morgan Good MRN: [id number] Date of Birth: Feb 23, 1951

## 2020-05-30 ENCOUNTER — Encounter: Payer: PPO | Admitting: Physical Therapy

## 2020-06-01 ENCOUNTER — Encounter: Payer: PPO | Admitting: Physical Therapy

## 2020-06-02 DIAGNOSIS — J029 Acute pharyngitis, unspecified: Secondary | ICD-10-CM | POA: Diagnosis not present

## 2020-06-06 ENCOUNTER — Encounter: Payer: PPO | Admitting: Physical Therapy

## 2020-06-07 DIAGNOSIS — F411 Generalized anxiety disorder: Secondary | ICD-10-CM | POA: Diagnosis not present

## 2020-06-07 DIAGNOSIS — F331 Major depressive disorder, recurrent, moderate: Secondary | ICD-10-CM | POA: Diagnosis not present

## 2020-06-08 ENCOUNTER — Encounter: Payer: PPO | Admitting: Physical Therapy

## 2020-06-14 DIAGNOSIS — F5105 Insomnia due to other mental disorder: Secondary | ICD-10-CM | POA: Diagnosis not present

## 2020-06-14 DIAGNOSIS — R41840 Attention and concentration deficit: Secondary | ICD-10-CM | POA: Diagnosis not present

## 2020-06-14 DIAGNOSIS — F332 Major depressive disorder, recurrent severe without psychotic features: Secondary | ICD-10-CM | POA: Diagnosis not present

## 2020-06-14 DIAGNOSIS — F411 Generalized anxiety disorder: Secondary | ICD-10-CM | POA: Diagnosis not present

## 2020-06-21 DIAGNOSIS — F331 Major depressive disorder, recurrent, moderate: Secondary | ICD-10-CM | POA: Diagnosis not present

## 2020-06-21 DIAGNOSIS — F411 Generalized anxiety disorder: Secondary | ICD-10-CM | POA: Diagnosis not present

## 2020-06-22 DIAGNOSIS — G40219 Localization-related (focal) (partial) symptomatic epilepsy and epileptic syndromes with complex partial seizures, intractable, without status epilepticus: Secondary | ICD-10-CM | POA: Diagnosis not present

## 2020-06-22 DIAGNOSIS — G20 Parkinson's disease: Secondary | ICD-10-CM | POA: Diagnosis not present

## 2020-06-22 DIAGNOSIS — G43011 Migraine without aura, intractable, with status migrainosus: Secondary | ICD-10-CM | POA: Diagnosis not present

## 2020-07-04 DIAGNOSIS — E538 Deficiency of other specified B group vitamins: Secondary | ICD-10-CM | POA: Diagnosis not present

## 2020-07-04 DIAGNOSIS — E7800 Pure hypercholesterolemia, unspecified: Secondary | ICD-10-CM | POA: Diagnosis not present

## 2020-07-04 DIAGNOSIS — E034 Atrophy of thyroid (acquired): Secondary | ICD-10-CM | POA: Diagnosis not present

## 2020-07-04 DIAGNOSIS — R739 Hyperglycemia, unspecified: Secondary | ICD-10-CM | POA: Diagnosis not present

## 2020-07-11 DIAGNOSIS — K760 Fatty (change of) liver, not elsewhere classified: Secondary | ICD-10-CM | POA: Diagnosis not present

## 2020-07-11 DIAGNOSIS — G40219 Localization-related (focal) (partial) symptomatic epilepsy and epileptic syndromes with complex partial seizures, intractable, without status epilepticus: Secondary | ICD-10-CM | POA: Diagnosis not present

## 2020-07-11 DIAGNOSIS — D72818 Other decreased white blood cell count: Secondary | ICD-10-CM | POA: Diagnosis not present

## 2020-07-11 DIAGNOSIS — K219 Gastro-esophageal reflux disease without esophagitis: Secondary | ICD-10-CM | POA: Diagnosis not present

## 2020-07-11 DIAGNOSIS — R739 Hyperglycemia, unspecified: Secondary | ICD-10-CM | POA: Diagnosis not present

## 2020-07-11 DIAGNOSIS — G4733 Obstructive sleep apnea (adult) (pediatric): Secondary | ICD-10-CM | POA: Diagnosis not present

## 2020-07-11 DIAGNOSIS — E034 Atrophy of thyroid (acquired): Secondary | ICD-10-CM | POA: Diagnosis not present

## 2020-07-11 DIAGNOSIS — K644 Residual hemorrhoidal skin tags: Secondary | ICD-10-CM | POA: Diagnosis not present

## 2020-07-11 DIAGNOSIS — E538 Deficiency of other specified B group vitamins: Secondary | ICD-10-CM | POA: Diagnosis not present

## 2020-07-11 DIAGNOSIS — F3342 Major depressive disorder, recurrent, in full remission: Secondary | ICD-10-CM | POA: Diagnosis not present

## 2020-07-11 DIAGNOSIS — E7800 Pure hypercholesterolemia, unspecified: Secondary | ICD-10-CM | POA: Diagnosis not present

## 2020-07-11 DIAGNOSIS — I700 Atherosclerosis of aorta: Secondary | ICD-10-CM | POA: Diagnosis not present

## 2020-07-12 DIAGNOSIS — F331 Major depressive disorder, recurrent, moderate: Secondary | ICD-10-CM | POA: Diagnosis not present

## 2020-07-12 DIAGNOSIS — F411 Generalized anxiety disorder: Secondary | ICD-10-CM | POA: Diagnosis not present

## 2020-07-18 DIAGNOSIS — K640 First degree hemorrhoids: Secondary | ICD-10-CM | POA: Diagnosis not present

## 2020-08-16 DIAGNOSIS — R4189 Other symptoms and signs involving cognitive functions and awareness: Secondary | ICD-10-CM | POA: Diagnosis not present

## 2020-08-16 DIAGNOSIS — R9082 White matter disease, unspecified: Secondary | ICD-10-CM | POA: Diagnosis not present

## 2020-08-16 DIAGNOSIS — G40219 Localization-related (focal) (partial) symptomatic epilepsy and epileptic syndromes with complex partial seizures, intractable, without status epilepticus: Secondary | ICD-10-CM | POA: Diagnosis not present

## 2020-08-16 DIAGNOSIS — G20 Parkinson's disease: Secondary | ICD-10-CM | POA: Diagnosis not present

## 2020-08-16 DIAGNOSIS — R2689 Other abnormalities of gait and mobility: Secondary | ICD-10-CM | POA: Diagnosis not present

## 2020-08-16 DIAGNOSIS — G43011 Migraine without aura, intractable, with status migrainosus: Secondary | ICD-10-CM | POA: Diagnosis not present

## 2020-09-09 DIAGNOSIS — F5105 Insomnia due to other mental disorder: Secondary | ICD-10-CM | POA: Diagnosis not present

## 2020-09-09 DIAGNOSIS — R41840 Attention and concentration deficit: Secondary | ICD-10-CM | POA: Diagnosis not present

## 2020-09-09 DIAGNOSIS — F332 Major depressive disorder, recurrent severe without psychotic features: Secondary | ICD-10-CM | POA: Diagnosis not present

## 2020-09-09 DIAGNOSIS — F411 Generalized anxiety disorder: Secondary | ICD-10-CM | POA: Diagnosis not present

## 2020-09-12 DIAGNOSIS — G4733 Obstructive sleep apnea (adult) (pediatric): Secondary | ICD-10-CM | POA: Diagnosis not present

## 2020-09-12 DIAGNOSIS — Z9989 Dependence on other enabling machines and devices: Secondary | ICD-10-CM | POA: Diagnosis not present

## 2020-09-18 DIAGNOSIS — G4733 Obstructive sleep apnea (adult) (pediatric): Secondary | ICD-10-CM | POA: Diagnosis not present

## 2020-09-27 DIAGNOSIS — F411 Generalized anxiety disorder: Secondary | ICD-10-CM | POA: Diagnosis not present

## 2020-09-27 DIAGNOSIS — F331 Major depressive disorder, recurrent, moderate: Secondary | ICD-10-CM | POA: Diagnosis not present

## 2020-10-19 DIAGNOSIS — R198 Other specified symptoms and signs involving the digestive system and abdomen: Secondary | ICD-10-CM | POA: Diagnosis not present

## 2020-10-31 DIAGNOSIS — R151 Fecal smearing: Secondary | ICD-10-CM | POA: Diagnosis not present

## 2020-10-31 DIAGNOSIS — K623 Rectal prolapse: Secondary | ICD-10-CM | POA: Diagnosis not present

## 2020-10-31 DIAGNOSIS — K5904 Chronic idiopathic constipation: Secondary | ICD-10-CM | POA: Diagnosis not present

## 2020-10-31 DIAGNOSIS — R198 Other specified symptoms and signs involving the digestive system and abdomen: Secondary | ICD-10-CM | POA: Diagnosis not present

## 2020-11-02 DIAGNOSIS — K6289 Other specified diseases of anus and rectum: Secondary | ICD-10-CM | POA: Diagnosis not present

## 2020-11-02 DIAGNOSIS — K5900 Constipation, unspecified: Secondary | ICD-10-CM | POA: Diagnosis not present

## 2020-11-07 DIAGNOSIS — H40053 Ocular hypertension, bilateral: Secondary | ICD-10-CM | POA: Diagnosis not present

## 2020-11-15 DIAGNOSIS — H2513 Age-related nuclear cataract, bilateral: Secondary | ICD-10-CM | POA: Diagnosis not present

## 2020-12-05 DIAGNOSIS — F411 Generalized anxiety disorder: Secondary | ICD-10-CM | POA: Diagnosis not present

## 2020-12-05 DIAGNOSIS — F5105 Insomnia due to other mental disorder: Secondary | ICD-10-CM | POA: Diagnosis not present

## 2020-12-05 DIAGNOSIS — R41840 Attention and concentration deficit: Secondary | ICD-10-CM | POA: Diagnosis not present

## 2020-12-05 DIAGNOSIS — F332 Major depressive disorder, recurrent severe without psychotic features: Secondary | ICD-10-CM | POA: Diagnosis not present

## 2020-12-26 DIAGNOSIS — L578 Other skin changes due to chronic exposure to nonionizing radiation: Secondary | ICD-10-CM | POA: Diagnosis not present

## 2020-12-26 DIAGNOSIS — L821 Other seborrheic keratosis: Secondary | ICD-10-CM | POA: Diagnosis not present

## 2020-12-26 DIAGNOSIS — B372 Candidiasis of skin and nail: Secondary | ICD-10-CM | POA: Diagnosis not present

## 2020-12-26 DIAGNOSIS — Z872 Personal history of diseases of the skin and subcutaneous tissue: Secondary | ICD-10-CM | POA: Diagnosis not present

## 2020-12-26 DIAGNOSIS — Z808 Family history of malignant neoplasm of other organs or systems: Secondary | ICD-10-CM | POA: Diagnosis not present

## 2020-12-26 DIAGNOSIS — Z86018 Personal history of other benign neoplasm: Secondary | ICD-10-CM | POA: Diagnosis not present

## 2021-01-03 DIAGNOSIS — E7800 Pure hypercholesterolemia, unspecified: Secondary | ICD-10-CM | POA: Diagnosis not present

## 2021-01-03 DIAGNOSIS — R739 Hyperglycemia, unspecified: Secondary | ICD-10-CM | POA: Diagnosis not present

## 2021-01-03 DIAGNOSIS — E538 Deficiency of other specified B group vitamins: Secondary | ICD-10-CM | POA: Diagnosis not present

## 2021-01-09 DIAGNOSIS — K760 Fatty (change of) liver, not elsewhere classified: Secondary | ICD-10-CM | POA: Diagnosis not present

## 2021-01-09 DIAGNOSIS — R0602 Shortness of breath: Secondary | ICD-10-CM | POA: Diagnosis not present

## 2021-01-09 DIAGNOSIS — Z23 Encounter for immunization: Secondary | ICD-10-CM | POA: Diagnosis not present

## 2021-01-09 DIAGNOSIS — G4733 Obstructive sleep apnea (adult) (pediatric): Secondary | ICD-10-CM | POA: Diagnosis not present

## 2021-01-09 DIAGNOSIS — F3342 Major depressive disorder, recurrent, in full remission: Secondary | ICD-10-CM | POA: Diagnosis not present

## 2021-01-09 DIAGNOSIS — G40219 Localization-related (focal) (partial) symptomatic epilepsy and epileptic syndromes with complex partial seizures, intractable, without status epilepticus: Secondary | ICD-10-CM | POA: Diagnosis not present

## 2021-01-09 DIAGNOSIS — R739 Hyperglycemia, unspecified: Secondary | ICD-10-CM | POA: Diagnosis not present

## 2021-01-09 DIAGNOSIS — E538 Deficiency of other specified B group vitamins: Secondary | ICD-10-CM | POA: Diagnosis not present

## 2021-01-09 DIAGNOSIS — D72818 Other decreased white blood cell count: Secondary | ICD-10-CM | POA: Diagnosis not present

## 2021-01-09 DIAGNOSIS — E7800 Pure hypercholesterolemia, unspecified: Secondary | ICD-10-CM | POA: Diagnosis not present

## 2021-01-09 DIAGNOSIS — E034 Atrophy of thyroid (acquired): Secondary | ICD-10-CM | POA: Diagnosis not present

## 2021-01-09 DIAGNOSIS — Z0000 Encounter for general adult medical examination without abnormal findings: Secondary | ICD-10-CM | POA: Diagnosis not present

## 2021-01-09 DIAGNOSIS — I700 Atherosclerosis of aorta: Secondary | ICD-10-CM | POA: Diagnosis not present

## 2021-01-09 DIAGNOSIS — M519 Unspecified thoracic, thoracolumbar and lumbosacral intervertebral disc disorder: Secondary | ICD-10-CM | POA: Diagnosis not present

## 2021-01-18 DIAGNOSIS — F411 Generalized anxiety disorder: Secondary | ICD-10-CM | POA: Diagnosis not present

## 2021-01-18 DIAGNOSIS — F5105 Insomnia due to other mental disorder: Secondary | ICD-10-CM | POA: Diagnosis not present

## 2021-01-18 DIAGNOSIS — R41840 Attention and concentration deficit: Secondary | ICD-10-CM | POA: Diagnosis not present

## 2021-01-18 DIAGNOSIS — F332 Major depressive disorder, recurrent severe without psychotic features: Secondary | ICD-10-CM | POA: Diagnosis not present

## 2021-01-24 DIAGNOSIS — R0602 Shortness of breath: Secondary | ICD-10-CM | POA: Diagnosis not present

## 2021-02-13 DIAGNOSIS — R4189 Other symptoms and signs involving cognitive functions and awareness: Secondary | ICD-10-CM | POA: Diagnosis not present

## 2021-02-13 DIAGNOSIS — G20 Parkinson's disease: Secondary | ICD-10-CM | POA: Diagnosis not present

## 2021-02-13 DIAGNOSIS — G43011 Migraine without aura, intractable, with status migrainosus: Secondary | ICD-10-CM | POA: Diagnosis not present

## 2021-02-13 DIAGNOSIS — G40219 Localization-related (focal) (partial) symptomatic epilepsy and epileptic syndromes with complex partial seizures, intractable, without status epilepticus: Secondary | ICD-10-CM | POA: Diagnosis not present

## 2021-02-13 DIAGNOSIS — R9082 White matter disease, unspecified: Secondary | ICD-10-CM | POA: Diagnosis not present

## 2021-02-27 DIAGNOSIS — R198 Other specified symptoms and signs involving the digestive system and abdomen: Secondary | ICD-10-CM | POA: Diagnosis not present

## 2021-02-27 DIAGNOSIS — R151 Fecal smearing: Secondary | ICD-10-CM | POA: Diagnosis not present

## 2021-03-06 DIAGNOSIS — N393 Stress incontinence (female) (male): Secondary | ICD-10-CM | POA: Diagnosis not present

## 2021-03-06 DIAGNOSIS — R198 Other specified symptoms and signs involving the digestive system and abdomen: Secondary | ICD-10-CM | POA: Diagnosis not present

## 2021-03-06 DIAGNOSIS — R151 Fecal smearing: Secondary | ICD-10-CM | POA: Diagnosis not present

## 2021-03-08 DIAGNOSIS — K5792 Diverticulitis of intestine, part unspecified, without perforation or abscess without bleeding: Secondary | ICD-10-CM | POA: Diagnosis not present

## 2021-03-08 DIAGNOSIS — K581 Irritable bowel syndrome with constipation: Secondary | ICD-10-CM | POA: Diagnosis not present

## 2021-03-15 ENCOUNTER — Ambulatory Visit
Admission: RE | Admit: 2021-03-15 | Discharge: 2021-03-15 | Disposition: A | Discharge status: Home / Self Care | Payer: PPO | Source: Ambulatory Visit | Attending: Family Medicine | Admitting: Family Medicine

## 2021-03-15 ENCOUNTER — Other Ambulatory Visit: Payer: Self-pay | Admitting: Family Medicine

## 2021-03-15 ENCOUNTER — Other Ambulatory Visit: Payer: Self-pay

## 2021-03-15 DIAGNOSIS — I517 Cardiomegaly: Secondary | ICD-10-CM | POA: Diagnosis not present

## 2021-03-15 DIAGNOSIS — I2510 Atherosclerotic heart disease of native coronary artery without angina pectoris: Secondary | ICD-10-CM | POA: Insufficient documentation

## 2021-03-15 DIAGNOSIS — M47816 Spondylosis without myelopathy or radiculopathy, lumbar region: Secondary | ICD-10-CM | POA: Diagnosis not present

## 2021-03-15 DIAGNOSIS — R112 Nausea with vomiting, unspecified: Secondary | ICD-10-CM | POA: Diagnosis not present

## 2021-03-15 DIAGNOSIS — R110 Nausea: Secondary | ICD-10-CM | POA: Diagnosis not present

## 2021-03-15 DIAGNOSIS — R197 Diarrhea, unspecified: Secondary | ICD-10-CM | POA: Insufficient documentation

## 2021-03-15 DIAGNOSIS — R1012 Left upper quadrant pain: Secondary | ICD-10-CM | POA: Diagnosis not present

## 2021-03-15 DIAGNOSIS — K5730 Diverticulosis of large intestine without perforation or abscess without bleeding: Secondary | ICD-10-CM | POA: Insufficient documentation

## 2021-03-15 DIAGNOSIS — R1032 Left lower quadrant pain: Secondary | ICD-10-CM | POA: Diagnosis not present

## 2021-03-15 DIAGNOSIS — I700 Atherosclerosis of aorta: Secondary | ICD-10-CM | POA: Diagnosis not present

## 2021-03-15 DIAGNOSIS — D259 Leiomyoma of uterus, unspecified: Secondary | ICD-10-CM | POA: Insufficient documentation

## 2021-03-17 DIAGNOSIS — K589 Irritable bowel syndrome without diarrhea: Secondary | ICD-10-CM | POA: Diagnosis not present

## 2021-03-17 DIAGNOSIS — R197 Diarrhea, unspecified: Secondary | ICD-10-CM | POA: Diagnosis not present

## 2021-03-17 DIAGNOSIS — R1032 Left lower quadrant pain: Secondary | ICD-10-CM | POA: Diagnosis not present

## 2021-04-20 DIAGNOSIS — F332 Major depressive disorder, recurrent severe without psychotic features: Secondary | ICD-10-CM | POA: Diagnosis not present

## 2021-04-20 DIAGNOSIS — R41840 Attention and concentration deficit: Secondary | ICD-10-CM | POA: Diagnosis not present

## 2021-04-20 DIAGNOSIS — F5105 Insomnia due to other mental disorder: Secondary | ICD-10-CM | POA: Diagnosis not present

## 2021-04-20 DIAGNOSIS — F411 Generalized anxiety disorder: Secondary | ICD-10-CM | POA: Diagnosis not present

## 2021-05-04 DIAGNOSIS — F411 Generalized anxiety disorder: Secondary | ICD-10-CM | POA: Diagnosis not present

## 2021-05-04 DIAGNOSIS — R41840 Attention and concentration deficit: Secondary | ICD-10-CM | POA: Diagnosis not present

## 2021-05-04 DIAGNOSIS — F5105 Insomnia due to other mental disorder: Secondary | ICD-10-CM | POA: Diagnosis not present

## 2021-05-04 DIAGNOSIS — F332 Major depressive disorder, recurrent severe without psychotic features: Secondary | ICD-10-CM | POA: Diagnosis not present

## 2021-06-12 DIAGNOSIS — F332 Major depressive disorder, recurrent severe without psychotic features: Secondary | ICD-10-CM | POA: Diagnosis not present

## 2021-06-12 DIAGNOSIS — F411 Generalized anxiety disorder: Secondary | ICD-10-CM | POA: Diagnosis not present

## 2021-06-12 DIAGNOSIS — F5105 Insomnia due to other mental disorder: Secondary | ICD-10-CM | POA: Diagnosis not present

## 2021-06-12 DIAGNOSIS — R41840 Attention and concentration deficit: Secondary | ICD-10-CM | POA: Diagnosis not present

## 2021-07-04 DIAGNOSIS — E7800 Pure hypercholesterolemia, unspecified: Secondary | ICD-10-CM | POA: Diagnosis not present

## 2021-07-04 DIAGNOSIS — D72818 Other decreased white blood cell count: Secondary | ICD-10-CM | POA: Diagnosis not present

## 2021-07-04 DIAGNOSIS — M545 Low back pain, unspecified: Secondary | ICD-10-CM | POA: Diagnosis not present

## 2021-07-04 DIAGNOSIS — E538 Deficiency of other specified B group vitamins: Secondary | ICD-10-CM | POA: Diagnosis not present

## 2021-07-04 DIAGNOSIS — R739 Hyperglycemia, unspecified: Secondary | ICD-10-CM | POA: Diagnosis not present

## 2021-07-04 DIAGNOSIS — E034 Atrophy of thyroid (acquired): Secondary | ICD-10-CM | POA: Diagnosis not present

## 2021-07-04 DIAGNOSIS — G40219 Localization-related (focal) (partial) symptomatic epilepsy and epileptic syndromes with complex partial seizures, intractable, without status epilepticus: Secondary | ICD-10-CM | POA: Diagnosis not present

## 2021-07-04 DIAGNOSIS — M5416 Radiculopathy, lumbar region: Secondary | ICD-10-CM | POA: Diagnosis not present

## 2021-07-11 DIAGNOSIS — E034 Atrophy of thyroid (acquired): Secondary | ICD-10-CM | POA: Diagnosis not present

## 2021-07-11 DIAGNOSIS — R739 Hyperglycemia, unspecified: Secondary | ICD-10-CM | POA: Diagnosis not present

## 2021-07-11 DIAGNOSIS — G40219 Localization-related (focal) (partial) symptomatic epilepsy and epileptic syndromes with complex partial seizures, intractable, without status epilepticus: Secondary | ICD-10-CM | POA: Diagnosis not present

## 2021-07-11 DIAGNOSIS — M519 Unspecified thoracic, thoracolumbar and lumbosacral intervertebral disc disorder: Secondary | ICD-10-CM | POA: Diagnosis not present

## 2021-07-11 DIAGNOSIS — N1831 Chronic kidney disease, stage 3a: Secondary | ICD-10-CM | POA: Diagnosis not present

## 2021-07-11 DIAGNOSIS — E538 Deficiency of other specified B group vitamins: Secondary | ICD-10-CM | POA: Diagnosis not present

## 2021-07-11 DIAGNOSIS — I700 Atherosclerosis of aorta: Secondary | ICD-10-CM | POA: Diagnosis not present

## 2021-07-11 DIAGNOSIS — E7800 Pure hypercholesterolemia, unspecified: Secondary | ICD-10-CM | POA: Diagnosis not present

## 2021-07-11 DIAGNOSIS — F3342 Major depressive disorder, recurrent, in full remission: Secondary | ICD-10-CM | POA: Diagnosis not present

## 2021-07-11 DIAGNOSIS — K219 Gastro-esophageal reflux disease without esophagitis: Secondary | ICD-10-CM | POA: Diagnosis not present

## 2021-08-01 ENCOUNTER — Other Ambulatory Visit: Payer: Self-pay | Admitting: Internal Medicine

## 2021-08-01 DIAGNOSIS — Z1231 Encounter for screening mammogram for malignant neoplasm of breast: Secondary | ICD-10-CM

## 2021-08-16 DIAGNOSIS — G20 Parkinson's disease: Secondary | ICD-10-CM | POA: Diagnosis not present

## 2021-08-16 DIAGNOSIS — M5481 Occipital neuralgia: Secondary | ICD-10-CM | POA: Diagnosis not present

## 2021-08-16 DIAGNOSIS — G43011 Migraine without aura, intractable, with status migrainosus: Secondary | ICD-10-CM | POA: Diagnosis not present

## 2021-08-16 DIAGNOSIS — G40219 Localization-related (focal) (partial) symptomatic epilepsy and epileptic syndromes with complex partial seizures, intractable, without status epilepticus: Secondary | ICD-10-CM | POA: Diagnosis not present

## 2021-09-13 DIAGNOSIS — F411 Generalized anxiety disorder: Secondary | ICD-10-CM | POA: Diagnosis not present

## 2021-09-13 DIAGNOSIS — F332 Major depressive disorder, recurrent severe without psychotic features: Secondary | ICD-10-CM | POA: Diagnosis not present

## 2021-09-13 DIAGNOSIS — R41840 Attention and concentration deficit: Secondary | ICD-10-CM | POA: Diagnosis not present

## 2021-09-13 DIAGNOSIS — F5105 Insomnia due to other mental disorder: Secondary | ICD-10-CM | POA: Diagnosis not present

## 2021-09-18 ENCOUNTER — Ambulatory Visit
Admission: RE | Admit: 2021-09-18 | Discharge: 2021-09-18 | Disposition: A | Discharge status: Home / Self Care | Payer: PPO | Source: Ambulatory Visit | Attending: Internal Medicine | Admitting: Internal Medicine

## 2021-09-18 ENCOUNTER — Other Ambulatory Visit: Payer: Self-pay

## 2021-09-18 DIAGNOSIS — Z1231 Encounter for screening mammogram for malignant neoplasm of breast: Secondary | ICD-10-CM | POA: Insufficient documentation

## 2021-09-26 DIAGNOSIS — M5481 Occipital neuralgia: Secondary | ICD-10-CM | POA: Diagnosis not present

## 2021-09-26 DIAGNOSIS — M542 Cervicalgia: Secondary | ICD-10-CM | POA: Diagnosis not present

## 2021-11-18 DIAGNOSIS — J40 Bronchitis, not specified as acute or chronic: Secondary | ICD-10-CM | POA: Diagnosis not present

## 2021-11-18 DIAGNOSIS — Z03818 Encounter for observation for suspected exposure to other biological agents ruled out: Secondary | ICD-10-CM | POA: Diagnosis not present

## 2021-11-18 DIAGNOSIS — R059 Cough, unspecified: Secondary | ICD-10-CM | POA: Diagnosis not present

## 2021-12-06 DIAGNOSIS — R41840 Attention and concentration deficit: Secondary | ICD-10-CM | POA: Diagnosis not present

## 2021-12-06 DIAGNOSIS — F332 Major depressive disorder, recurrent severe without psychotic features: Secondary | ICD-10-CM | POA: Diagnosis not present

## 2021-12-06 DIAGNOSIS — F5105 Insomnia due to other mental disorder: Secondary | ICD-10-CM | POA: Diagnosis not present

## 2021-12-06 DIAGNOSIS — F411 Generalized anxiety disorder: Secondary | ICD-10-CM | POA: Diagnosis not present

## 2021-12-27 DIAGNOSIS — L821 Other seborrheic keratosis: Secondary | ICD-10-CM | POA: Diagnosis not present

## 2021-12-27 DIAGNOSIS — L570 Actinic keratosis: Secondary | ICD-10-CM | POA: Diagnosis not present

## 2021-12-27 DIAGNOSIS — L918 Other hypertrophic disorders of the skin: Secondary | ICD-10-CM | POA: Diagnosis not present

## 2021-12-27 DIAGNOSIS — L578 Other skin changes due to chronic exposure to nonionizing radiation: Secondary | ICD-10-CM | POA: Diagnosis not present

## 2021-12-27 DIAGNOSIS — Z86018 Personal history of other benign neoplasm: Secondary | ICD-10-CM | POA: Diagnosis not present

## 2022-01-09 DIAGNOSIS — R739 Hyperglycemia, unspecified: Secondary | ICD-10-CM | POA: Diagnosis not present

## 2022-01-09 DIAGNOSIS — E538 Deficiency of other specified B group vitamins: Secondary | ICD-10-CM | POA: Diagnosis not present

## 2022-01-09 DIAGNOSIS — E7800 Pure hypercholesterolemia, unspecified: Secondary | ICD-10-CM | POA: Diagnosis not present

## 2022-01-09 DIAGNOSIS — N1831 Chronic kidney disease, stage 3a: Secondary | ICD-10-CM | POA: Diagnosis not present

## 2022-01-23 DIAGNOSIS — F3342 Major depressive disorder, recurrent, in full remission: Secondary | ICD-10-CM | POA: Diagnosis not present

## 2022-01-23 DIAGNOSIS — I700 Atherosclerosis of aorta: Secondary | ICD-10-CM | POA: Diagnosis not present

## 2022-01-23 DIAGNOSIS — E538 Deficiency of other specified B group vitamins: Secondary | ICD-10-CM | POA: Diagnosis not present

## 2022-01-23 DIAGNOSIS — G4733 Obstructive sleep apnea (adult) (pediatric): Secondary | ICD-10-CM | POA: Diagnosis not present

## 2022-01-23 DIAGNOSIS — R7303 Prediabetes: Secondary | ICD-10-CM | POA: Diagnosis not present

## 2022-01-23 DIAGNOSIS — E7800 Pure hypercholesterolemia, unspecified: Secondary | ICD-10-CM | POA: Diagnosis not present

## 2022-01-23 DIAGNOSIS — K760 Fatty (change of) liver, not elsewhere classified: Secondary | ICD-10-CM | POA: Diagnosis not present

## 2022-01-23 DIAGNOSIS — Z0000 Encounter for general adult medical examination without abnormal findings: Secondary | ICD-10-CM | POA: Diagnosis not present

## 2022-01-23 DIAGNOSIS — Z780 Asymptomatic menopausal state: Secondary | ICD-10-CM | POA: Diagnosis not present

## 2022-01-23 DIAGNOSIS — G40219 Localization-related (focal) (partial) symptomatic epilepsy and epileptic syndromes with complex partial seizures, intractable, without status epilepticus: Secondary | ICD-10-CM | POA: Diagnosis not present

## 2022-01-23 DIAGNOSIS — E034 Atrophy of thyroid (acquired): Secondary | ICD-10-CM | POA: Diagnosis not present

## 2022-01-23 DIAGNOSIS — M519 Unspecified thoracic, thoracolumbar and lumbosacral intervertebral disc disorder: Secondary | ICD-10-CM | POA: Diagnosis not present

## 2022-02-14 DIAGNOSIS — M8588 Other specified disorders of bone density and structure, other site: Secondary | ICD-10-CM | POA: Diagnosis not present

## 2022-03-07 DIAGNOSIS — F332 Major depressive disorder, recurrent severe without psychotic features: Secondary | ICD-10-CM | POA: Diagnosis not present

## 2022-03-07 DIAGNOSIS — F5105 Insomnia due to other mental disorder: Secondary | ICD-10-CM | POA: Diagnosis not present

## 2022-03-07 DIAGNOSIS — R41840 Attention and concentration deficit: Secondary | ICD-10-CM | POA: Diagnosis not present

## 2022-03-07 DIAGNOSIS — F411 Generalized anxiety disorder: Secondary | ICD-10-CM | POA: Diagnosis not present

## 2022-03-12 DIAGNOSIS — M222X1 Patellofemoral disorders, right knee: Secondary | ICD-10-CM | POA: Diagnosis not present

## 2022-03-12 DIAGNOSIS — M25511 Pain in right shoulder: Secondary | ICD-10-CM | POA: Diagnosis not present

## 2022-03-12 DIAGNOSIS — M7541 Impingement syndrome of right shoulder: Secondary | ICD-10-CM | POA: Diagnosis not present

## 2022-03-12 DIAGNOSIS — M25561 Pain in right knee: Secondary | ICD-10-CM | POA: Diagnosis not present

## 2022-03-16 DIAGNOSIS — M7918 Myalgia, other site: Secondary | ICD-10-CM | POA: Diagnosis not present

## 2022-03-16 DIAGNOSIS — G20 Parkinson's disease: Secondary | ICD-10-CM | POA: Diagnosis not present

## 2022-03-16 DIAGNOSIS — M5481 Occipital neuralgia: Secondary | ICD-10-CM | POA: Diagnosis not present

## 2022-03-16 DIAGNOSIS — M542 Cervicalgia: Secondary | ICD-10-CM | POA: Diagnosis not present

## 2022-03-16 DIAGNOSIS — G40219 Localization-related (focal) (partial) symptomatic epilepsy and epileptic syndromes with complex partial seizures, intractable, without status epilepticus: Secondary | ICD-10-CM | POA: Diagnosis not present

## 2022-03-21 ENCOUNTER — Other Ambulatory Visit: Payer: Self-pay | Admitting: Student

## 2022-03-21 DIAGNOSIS — M5481 Occipital neuralgia: Secondary | ICD-10-CM

## 2022-03-31 ENCOUNTER — Ambulatory Visit
Admission: RE | Admit: 2022-03-31 | Discharge: 2022-03-31 | Disposition: A | Discharge status: Home / Self Care | Payer: PPO | Source: Ambulatory Visit | Attending: Student | Admitting: Student

## 2022-03-31 DIAGNOSIS — M5481 Occipital neuralgia: Secondary | ICD-10-CM | POA: Diagnosis not present

## 2022-03-31 DIAGNOSIS — M4312 Spondylolisthesis, cervical region: Secondary | ICD-10-CM | POA: Diagnosis not present

## 2022-04-04 DIAGNOSIS — R140 Abdominal distension (gaseous): Secondary | ICD-10-CM | POA: Diagnosis not present

## 2022-04-04 DIAGNOSIS — K589 Irritable bowel syndrome without diarrhea: Secondary | ICD-10-CM | POA: Diagnosis not present

## 2022-04-04 DIAGNOSIS — R198 Other specified symptoms and signs involving the digestive system and abdomen: Secondary | ICD-10-CM | POA: Diagnosis not present

## 2022-04-11 DIAGNOSIS — H2513 Age-related nuclear cataract, bilateral: Secondary | ICD-10-CM | POA: Diagnosis not present

## 2022-04-16 DIAGNOSIS — M542 Cervicalgia: Secondary | ICD-10-CM | POA: Diagnosis not present

## 2022-04-16 DIAGNOSIS — G40219 Localization-related (focal) (partial) symptomatic epilepsy and epileptic syndromes with complex partial seizures, intractable, without status epilepticus: Secondary | ICD-10-CM | POA: Diagnosis not present

## 2022-04-16 DIAGNOSIS — M5481 Occipital neuralgia: Secondary | ICD-10-CM | POA: Diagnosis not present

## 2022-04-16 DIAGNOSIS — M7918 Myalgia, other site: Secondary | ICD-10-CM | POA: Diagnosis not present

## 2022-04-16 DIAGNOSIS — G20 Parkinson's disease: Secondary | ICD-10-CM | POA: Diagnosis not present

## 2022-04-24 DIAGNOSIS — R198 Other specified symptoms and signs involving the digestive system and abdomen: Secondary | ICD-10-CM | POA: Diagnosis not present

## 2022-04-24 DIAGNOSIS — R140 Abdominal distension (gaseous): Secondary | ICD-10-CM | POA: Diagnosis not present

## 2022-07-02 DIAGNOSIS — R200 Anesthesia of skin: Secondary | ICD-10-CM | POA: Diagnosis not present

## 2022-07-09 DIAGNOSIS — G20C Parkinsonism, unspecified: Secondary | ICD-10-CM | POA: Diagnosis not present

## 2022-07-09 DIAGNOSIS — E034 Atrophy of thyroid (acquired): Secondary | ICD-10-CM | POA: Diagnosis not present

## 2022-07-09 DIAGNOSIS — E7800 Pure hypercholesterolemia, unspecified: Secondary | ICD-10-CM | POA: Diagnosis not present

## 2022-07-09 DIAGNOSIS — R7303 Prediabetes: Secondary | ICD-10-CM | POA: Diagnosis not present

## 2022-07-09 DIAGNOSIS — E538 Deficiency of other specified B group vitamins: Secondary | ICD-10-CM | POA: Diagnosis not present

## 2022-07-10 DIAGNOSIS — K760 Fatty (change of) liver, not elsewhere classified: Secondary | ICD-10-CM | POA: Diagnosis not present

## 2022-07-10 DIAGNOSIS — R198 Other specified symptoms and signs involving the digestive system and abdomen: Secondary | ICD-10-CM | POA: Diagnosis not present

## 2022-07-10 DIAGNOSIS — K589 Irritable bowel syndrome without diarrhea: Secondary | ICD-10-CM | POA: Diagnosis not present

## 2022-07-12 DIAGNOSIS — F5105 Insomnia due to other mental disorder: Secondary | ICD-10-CM | POA: Diagnosis not present

## 2022-07-12 DIAGNOSIS — F332 Major depressive disorder, recurrent severe without psychotic features: Secondary | ICD-10-CM | POA: Diagnosis not present

## 2022-07-12 DIAGNOSIS — F411 Generalized anxiety disorder: Secondary | ICD-10-CM | POA: Diagnosis not present

## 2022-07-12 DIAGNOSIS — R41840 Attention and concentration deficit: Secondary | ICD-10-CM | POA: Diagnosis not present

## 2022-07-16 DIAGNOSIS — M542 Cervicalgia: Secondary | ICD-10-CM | POA: Diagnosis not present

## 2022-07-16 DIAGNOSIS — M519 Unspecified thoracic, thoracolumbar and lumbosacral intervertebral disc disorder: Secondary | ICD-10-CM | POA: Diagnosis not present

## 2022-07-16 DIAGNOSIS — F3342 Major depressive disorder, recurrent, in full remission: Secondary | ICD-10-CM | POA: Diagnosis not present

## 2022-07-16 DIAGNOSIS — G20C Parkinsonism, unspecified: Secondary | ICD-10-CM | POA: Diagnosis not present

## 2022-07-16 DIAGNOSIS — R519 Headache, unspecified: Secondary | ICD-10-CM | POA: Diagnosis not present

## 2022-07-16 DIAGNOSIS — M7918 Myalgia, other site: Secondary | ICD-10-CM | POA: Diagnosis not present

## 2022-07-16 DIAGNOSIS — K219 Gastro-esophageal reflux disease without esophagitis: Secondary | ICD-10-CM | POA: Diagnosis not present

## 2022-07-16 DIAGNOSIS — I700 Atherosclerosis of aorta: Secondary | ICD-10-CM | POA: Diagnosis not present

## 2022-07-16 DIAGNOSIS — Z2821 Immunization not carried out because of patient refusal: Secondary | ICD-10-CM | POA: Diagnosis not present

## 2022-07-16 DIAGNOSIS — R7303 Prediabetes: Secondary | ICD-10-CM | POA: Diagnosis not present

## 2022-07-16 DIAGNOSIS — G5603 Carpal tunnel syndrome, bilateral upper limbs: Secondary | ICD-10-CM | POA: Diagnosis not present

## 2022-07-16 DIAGNOSIS — E034 Atrophy of thyroid (acquired): Secondary | ICD-10-CM | POA: Diagnosis not present

## 2022-07-16 DIAGNOSIS — D72818 Other decreased white blood cell count: Secondary | ICD-10-CM | POA: Diagnosis not present

## 2022-07-16 DIAGNOSIS — G4733 Obstructive sleep apnea (adult) (pediatric): Secondary | ICD-10-CM | POA: Diagnosis not present

## 2022-07-16 DIAGNOSIS — E538 Deficiency of other specified B group vitamins: Secondary | ICD-10-CM | POA: Diagnosis not present

## 2022-07-16 DIAGNOSIS — G40219 Localization-related (focal) (partial) symptomatic epilepsy and epileptic syndromes with complex partial seizures, intractable, without status epilepticus: Secondary | ICD-10-CM | POA: Diagnosis not present

## 2022-07-16 DIAGNOSIS — E7800 Pure hypercholesterolemia, unspecified: Secondary | ICD-10-CM | POA: Diagnosis not present

## 2022-07-24 DIAGNOSIS — H1131 Conjunctival hemorrhage, right eye: Secondary | ICD-10-CM | POA: Diagnosis not present

## 2022-08-09 DIAGNOSIS — J069 Acute upper respiratory infection, unspecified: Secondary | ICD-10-CM | POA: Diagnosis not present

## 2022-08-09 DIAGNOSIS — I700 Atherosclerosis of aorta: Secondary | ICD-10-CM | POA: Diagnosis not present

## 2022-08-09 DIAGNOSIS — J0110 Acute frontal sinusitis, unspecified: Secondary | ICD-10-CM | POA: Diagnosis not present

## 2022-08-09 DIAGNOSIS — G40219 Localization-related (focal) (partial) symptomatic epilepsy and epileptic syndromes with complex partial seizures, intractable, without status epilepticus: Secondary | ICD-10-CM | POA: Diagnosis not present

## 2022-08-09 DIAGNOSIS — Z2821 Immunization not carried out because of patient refusal: Secondary | ICD-10-CM | POA: Diagnosis not present

## 2022-08-09 DIAGNOSIS — R41840 Attention and concentration deficit: Secondary | ICD-10-CM | POA: Diagnosis not present

## 2022-08-09 DIAGNOSIS — F411 Generalized anxiety disorder: Secondary | ICD-10-CM | POA: Diagnosis not present

## 2022-08-09 DIAGNOSIS — F5105 Insomnia due to other mental disorder: Secondary | ICD-10-CM | POA: Diagnosis not present

## 2022-08-09 DIAGNOSIS — F3342 Major depressive disorder, recurrent, in full remission: Secondary | ICD-10-CM | POA: Diagnosis not present

## 2022-08-09 DIAGNOSIS — F332 Major depressive disorder, recurrent severe without psychotic features: Secondary | ICD-10-CM | POA: Diagnosis not present

## 2022-08-27 ENCOUNTER — Other Ambulatory Visit: Payer: Self-pay | Admitting: Internal Medicine

## 2022-08-27 DIAGNOSIS — Z1231 Encounter for screening mammogram for malignant neoplasm of breast: Secondary | ICD-10-CM

## 2022-09-25 ENCOUNTER — Inpatient Hospital Stay: Admission: RE | Admit: 2022-09-25 | Payer: PPO | Source: Ambulatory Visit

## 2022-09-26 ENCOUNTER — Ambulatory Visit
Admission: RE | Admit: 2022-09-26 | Discharge: 2022-09-26 | Disposition: A | Discharge status: Home / Self Care | Payer: PPO | Source: Ambulatory Visit | Attending: Internal Medicine | Admitting: Internal Medicine

## 2022-09-26 DIAGNOSIS — Z1231 Encounter for screening mammogram for malignant neoplasm of breast: Secondary | ICD-10-CM | POA: Insufficient documentation

## 2022-10-09 DIAGNOSIS — M170 Bilateral primary osteoarthritis of knee: Secondary | ICD-10-CM | POA: Diagnosis not present

## 2022-10-09 DIAGNOSIS — M7052 Other bursitis of knee, left knee: Secondary | ICD-10-CM | POA: Diagnosis not present

## 2022-10-09 DIAGNOSIS — G8929 Other chronic pain: Secondary | ICD-10-CM | POA: Diagnosis not present

## 2022-11-08 DIAGNOSIS — F5105 Insomnia due to other mental disorder: Secondary | ICD-10-CM | POA: Diagnosis not present

## 2022-11-08 DIAGNOSIS — R41840 Attention and concentration deficit: Secondary | ICD-10-CM | POA: Diagnosis not present

## 2022-11-08 DIAGNOSIS — F411 Generalized anxiety disorder: Secondary | ICD-10-CM | POA: Diagnosis not present

## 2022-11-08 DIAGNOSIS — F332 Major depressive disorder, recurrent severe without psychotic features: Secondary | ICD-10-CM | POA: Diagnosis not present

## 2022-11-27 DIAGNOSIS — K581 Irritable bowel syndrome with constipation: Secondary | ICD-10-CM | POA: Diagnosis not present

## 2022-11-29 DIAGNOSIS — M48062 Spinal stenosis, lumbar region with neurogenic claudication: Secondary | ICD-10-CM | POA: Diagnosis not present

## 2022-11-29 DIAGNOSIS — M519 Unspecified thoracic, thoracolumbar and lumbosacral intervertebral disc disorder: Secondary | ICD-10-CM | POA: Diagnosis not present

## 2023-01-01 DIAGNOSIS — D225 Melanocytic nevi of trunk: Secondary | ICD-10-CM | POA: Diagnosis not present

## 2023-01-01 DIAGNOSIS — D485 Neoplasm of uncertain behavior of skin: Secondary | ICD-10-CM | POA: Diagnosis not present

## 2023-01-01 DIAGNOSIS — Z872 Personal history of diseases of the skin and subcutaneous tissue: Secondary | ICD-10-CM | POA: Diagnosis not present

## 2023-01-01 DIAGNOSIS — L578 Other skin changes due to chronic exposure to nonionizing radiation: Secondary | ICD-10-CM | POA: Diagnosis not present

## 2023-01-01 DIAGNOSIS — Z86018 Personal history of other benign neoplasm: Secondary | ICD-10-CM | POA: Diagnosis not present

## 2023-01-01 DIAGNOSIS — L821 Other seborrheic keratosis: Secondary | ICD-10-CM | POA: Diagnosis not present

## 2023-01-15 DIAGNOSIS — G40219 Localization-related (focal) (partial) symptomatic epilepsy and epileptic syndromes with complex partial seizures, intractable, without status epilepticus: Secondary | ICD-10-CM | POA: Diagnosis not present

## 2023-01-15 DIAGNOSIS — R519 Headache, unspecified: Secondary | ICD-10-CM | POA: Diagnosis not present

## 2023-01-15 DIAGNOSIS — G20C Parkinsonism, unspecified: Secondary | ICD-10-CM | POA: Diagnosis not present

## 2023-01-16 ENCOUNTER — Other Ambulatory Visit: Payer: Self-pay | Admitting: Student

## 2023-01-16 DIAGNOSIS — R519 Headache, unspecified: Secondary | ICD-10-CM

## 2023-01-30 DIAGNOSIS — R519 Headache, unspecified: Secondary | ICD-10-CM | POA: Diagnosis not present

## 2023-01-30 DIAGNOSIS — F5105 Insomnia due to other mental disorder: Secondary | ICD-10-CM | POA: Diagnosis not present

## 2023-01-30 DIAGNOSIS — R7303 Prediabetes: Secondary | ICD-10-CM | POA: Diagnosis not present

## 2023-01-30 DIAGNOSIS — G40219 Localization-related (focal) (partial) symptomatic epilepsy and epileptic syndromes with complex partial seizures, intractable, without status epilepticus: Secondary | ICD-10-CM | POA: Diagnosis not present

## 2023-01-30 DIAGNOSIS — E538 Deficiency of other specified B group vitamins: Secondary | ICD-10-CM | POA: Diagnosis not present

## 2023-01-30 DIAGNOSIS — R41840 Attention and concentration deficit: Secondary | ICD-10-CM | POA: Diagnosis not present

## 2023-01-30 DIAGNOSIS — F332 Major depressive disorder, recurrent severe without psychotic features: Secondary | ICD-10-CM | POA: Diagnosis not present

## 2023-01-30 DIAGNOSIS — F411 Generalized anxiety disorder: Secondary | ICD-10-CM | POA: Diagnosis not present

## 2023-01-30 DIAGNOSIS — E7800 Pure hypercholesterolemia, unspecified: Secondary | ICD-10-CM | POA: Diagnosis not present

## 2023-01-31 DIAGNOSIS — D235 Other benign neoplasm of skin of trunk: Secondary | ICD-10-CM | POA: Diagnosis not present

## 2023-01-31 DIAGNOSIS — D225 Melanocytic nevi of trunk: Secondary | ICD-10-CM | POA: Diagnosis not present

## 2023-02-04 ENCOUNTER — Ambulatory Visit
Admission: RE | Admit: 2023-02-04 | Discharge: 2023-02-04 | Disposition: A | Discharge status: Home / Self Care | Payer: PPO | Source: Ambulatory Visit | Attending: Student | Admitting: Student

## 2023-02-04 DIAGNOSIS — R519 Headache, unspecified: Secondary | ICD-10-CM

## 2023-02-05 DIAGNOSIS — M7918 Myalgia, other site: Secondary | ICD-10-CM | POA: Diagnosis not present

## 2023-02-05 DIAGNOSIS — R7303 Prediabetes: Secondary | ICD-10-CM | POA: Diagnosis not present

## 2023-02-05 DIAGNOSIS — M5481 Occipital neuralgia: Secondary | ICD-10-CM | POA: Diagnosis not present

## 2023-02-12 DIAGNOSIS — E7800 Pure hypercholesterolemia, unspecified: Secondary | ICD-10-CM | POA: Diagnosis not present

## 2023-02-12 DIAGNOSIS — G40219 Localization-related (focal) (partial) symptomatic epilepsy and epileptic syndromes with complex partial seizures, intractable, without status epilepticus: Secondary | ICD-10-CM | POA: Diagnosis not present

## 2023-02-12 DIAGNOSIS — R001 Bradycardia, unspecified: Secondary | ICD-10-CM | POA: Diagnosis not present

## 2023-02-12 DIAGNOSIS — E034 Atrophy of thyroid (acquired): Secondary | ICD-10-CM | POA: Diagnosis not present

## 2023-02-12 DIAGNOSIS — K760 Fatty (change of) liver, not elsewhere classified: Secondary | ICD-10-CM | POA: Diagnosis not present

## 2023-02-12 DIAGNOSIS — Z0000 Encounter for general adult medical examination without abnormal findings: Secondary | ICD-10-CM | POA: Diagnosis not present

## 2023-02-12 DIAGNOSIS — R7303 Prediabetes: Secondary | ICD-10-CM | POA: Diagnosis not present

## 2023-02-12 DIAGNOSIS — G4733 Obstructive sleep apnea (adult) (pediatric): Secondary | ICD-10-CM | POA: Diagnosis not present

## 2023-02-12 DIAGNOSIS — I700 Atherosclerosis of aorta: Secondary | ICD-10-CM | POA: Diagnosis not present

## 2023-02-12 DIAGNOSIS — G20C Parkinsonism, unspecified: Secondary | ICD-10-CM | POA: Diagnosis not present

## 2023-02-12 DIAGNOSIS — E538 Deficiency of other specified B group vitamins: Secondary | ICD-10-CM | POA: Diagnosis not present

## 2023-02-12 DIAGNOSIS — K219 Gastro-esophageal reflux disease without esophagitis: Secondary | ICD-10-CM | POA: Diagnosis not present

## 2023-02-12 DIAGNOSIS — F3342 Major depressive disorder, recurrent, in full remission: Secondary | ICD-10-CM | POA: Diagnosis not present

## 2023-02-26 DIAGNOSIS — R001 Bradycardia, unspecified: Secondary | ICD-10-CM | POA: Diagnosis not present

## 2023-03-08 DIAGNOSIS — G20C Parkinsonism, unspecified: Secondary | ICD-10-CM | POA: Diagnosis not present

## 2023-03-08 DIAGNOSIS — I700 Atherosclerosis of aorta: Secondary | ICD-10-CM | POA: Diagnosis not present

## 2023-03-08 DIAGNOSIS — G4733 Obstructive sleep apnea (adult) (pediatric): Secondary | ICD-10-CM | POA: Diagnosis not present

## 2023-03-08 DIAGNOSIS — F3342 Major depressive disorder, recurrent, in full remission: Secondary | ICD-10-CM | POA: Diagnosis not present

## 2023-03-08 DIAGNOSIS — G40219 Localization-related (focal) (partial) symptomatic epilepsy and epileptic syndromes with complex partial seizures, intractable, without status epilepticus: Secondary | ICD-10-CM | POA: Diagnosis not present

## 2023-03-08 DIAGNOSIS — R001 Bradycardia, unspecified: Secondary | ICD-10-CM | POA: Diagnosis not present

## 2023-03-08 DIAGNOSIS — R7303 Prediabetes: Secondary | ICD-10-CM | POA: Diagnosis not present

## 2023-04-11 DIAGNOSIS — R1013 Epigastric pain: Secondary | ICD-10-CM | POA: Diagnosis not present

## 2023-04-11 DIAGNOSIS — U071 COVID-19: Secondary | ICD-10-CM | POA: Diagnosis not present

## 2023-04-11 DIAGNOSIS — R197 Diarrhea, unspecified: Secondary | ICD-10-CM | POA: Diagnosis not present

## 2023-04-23 DIAGNOSIS — H2513 Age-related nuclear cataract, bilateral: Secondary | ICD-10-CM | POA: Diagnosis not present

## 2023-04-23 DIAGNOSIS — H40053 Ocular hypertension, bilateral: Secondary | ICD-10-CM | POA: Diagnosis not present

## 2023-04-26 DIAGNOSIS — R41840 Attention and concentration deficit: Secondary | ICD-10-CM | POA: Diagnosis not present

## 2023-04-26 DIAGNOSIS — F332 Major depressive disorder, recurrent severe without psychotic features: Secondary | ICD-10-CM | POA: Diagnosis not present

## 2023-04-26 DIAGNOSIS — F411 Generalized anxiety disorder: Secondary | ICD-10-CM | POA: Diagnosis not present

## 2023-04-26 DIAGNOSIS — F5105 Insomnia due to other mental disorder: Secondary | ICD-10-CM | POA: Diagnosis not present

## 2023-05-16 DIAGNOSIS — R519 Headache, unspecified: Secondary | ICD-10-CM | POA: Diagnosis not present

## 2023-05-16 DIAGNOSIS — M542 Cervicalgia: Secondary | ICD-10-CM | POA: Diagnosis not present

## 2023-05-16 DIAGNOSIS — G40219 Localization-related (focal) (partial) symptomatic epilepsy and epileptic syndromes with complex partial seizures, intractable, without status epilepticus: Secondary | ICD-10-CM | POA: Diagnosis not present

## 2023-05-16 DIAGNOSIS — G20C Parkinsonism, unspecified: Secondary | ICD-10-CM | POA: Diagnosis not present

## 2023-06-26 DIAGNOSIS — K581 Irritable bowel syndrome with constipation: Secondary | ICD-10-CM | POA: Diagnosis not present

## 2023-06-26 DIAGNOSIS — K219 Gastro-esophageal reflux disease without esophagitis: Secondary | ICD-10-CM | POA: Diagnosis not present

## 2023-08-14 DIAGNOSIS — G4733 Obstructive sleep apnea (adult) (pediatric): Secondary | ICD-10-CM | POA: Diagnosis not present

## 2023-08-14 DIAGNOSIS — E7800 Pure hypercholesterolemia, unspecified: Secondary | ICD-10-CM | POA: Diagnosis not present

## 2023-08-14 DIAGNOSIS — E538 Deficiency of other specified B group vitamins: Secondary | ICD-10-CM | POA: Diagnosis not present

## 2023-08-14 DIAGNOSIS — R7303 Prediabetes: Secondary | ICD-10-CM | POA: Diagnosis not present

## 2023-08-14 DIAGNOSIS — E559 Vitamin D deficiency, unspecified: Secondary | ICD-10-CM | POA: Diagnosis not present

## 2023-08-14 DIAGNOSIS — I700 Atherosclerosis of aorta: Secondary | ICD-10-CM | POA: Diagnosis not present

## 2023-08-20 DIAGNOSIS — G8929 Other chronic pain: Secondary | ICD-10-CM | POA: Diagnosis not present

## 2023-08-20 DIAGNOSIS — M5442 Lumbago with sciatica, left side: Secondary | ICD-10-CM | POA: Diagnosis not present

## 2023-08-20 DIAGNOSIS — M5416 Radiculopathy, lumbar region: Secondary | ICD-10-CM | POA: Diagnosis not present

## 2023-08-20 DIAGNOSIS — M5441 Lumbago with sciatica, right side: Secondary | ICD-10-CM | POA: Diagnosis not present

## 2023-08-21 DIAGNOSIS — E538 Deficiency of other specified B group vitamins: Secondary | ICD-10-CM | POA: Diagnosis not present

## 2023-08-21 DIAGNOSIS — G20C Parkinsonism, unspecified: Secondary | ICD-10-CM | POA: Diagnosis not present

## 2023-08-21 DIAGNOSIS — E034 Atrophy of thyroid (acquired): Secondary | ICD-10-CM | POA: Diagnosis not present

## 2023-08-21 DIAGNOSIS — E7800 Pure hypercholesterolemia, unspecified: Secondary | ICD-10-CM | POA: Diagnosis not present

## 2023-08-21 DIAGNOSIS — G4733 Obstructive sleep apnea (adult) (pediatric): Secondary | ICD-10-CM | POA: Diagnosis not present

## 2023-08-21 DIAGNOSIS — G40219 Localization-related (focal) (partial) symptomatic epilepsy and epileptic syndromes with complex partial seizures, intractable, without status epilepticus: Secondary | ICD-10-CM | POA: Diagnosis not present

## 2023-08-21 DIAGNOSIS — R7303 Prediabetes: Secondary | ICD-10-CM | POA: Diagnosis not present

## 2023-08-21 DIAGNOSIS — F3342 Major depressive disorder, recurrent, in full remission: Secondary | ICD-10-CM | POA: Diagnosis not present

## 2023-08-21 DIAGNOSIS — N1831 Chronic kidney disease, stage 3a: Secondary | ICD-10-CM | POA: Diagnosis not present

## 2023-08-21 DIAGNOSIS — I700 Atherosclerosis of aorta: Secondary | ICD-10-CM | POA: Diagnosis not present

## 2023-08-26 DIAGNOSIS — F5105 Insomnia due to other mental disorder: Secondary | ICD-10-CM | POA: Diagnosis not present

## 2023-08-26 DIAGNOSIS — F411 Generalized anxiety disorder: Secondary | ICD-10-CM | POA: Diagnosis not present

## 2023-08-26 DIAGNOSIS — F332 Major depressive disorder, recurrent severe without psychotic features: Secondary | ICD-10-CM | POA: Diagnosis not present

## 2023-08-26 DIAGNOSIS — R41840 Attention and concentration deficit: Secondary | ICD-10-CM | POA: Diagnosis not present

## 2023-09-04 ENCOUNTER — Other Ambulatory Visit: Payer: Self-pay | Admitting: Physical Medicine & Rehabilitation

## 2023-09-04 DIAGNOSIS — G8929 Other chronic pain: Secondary | ICD-10-CM

## 2023-09-07 ENCOUNTER — Ambulatory Visit
Admission: RE | Admit: 2023-09-07 | Discharge: 2023-09-07 | Disposition: A | Discharge status: Home / Self Care | Payer: PPO | Source: Ambulatory Visit | Attending: Physical Medicine & Rehabilitation | Admitting: Physical Medicine & Rehabilitation

## 2023-09-07 DIAGNOSIS — G8929 Other chronic pain: Secondary | ICD-10-CM

## 2023-09-07 DIAGNOSIS — M47816 Spondylosis without myelopathy or radiculopathy, lumbar region: Secondary | ICD-10-CM | POA: Diagnosis not present

## 2023-09-07 DIAGNOSIS — M5126 Other intervertebral disc displacement, lumbar region: Secondary | ICD-10-CM | POA: Diagnosis not present

## 2023-09-13 DIAGNOSIS — M545 Low back pain, unspecified: Secondary | ICD-10-CM | POA: Diagnosis not present

## 2023-09-16 DIAGNOSIS — M542 Cervicalgia: Secondary | ICD-10-CM | POA: Diagnosis not present

## 2023-09-16 DIAGNOSIS — G20C Parkinsonism, unspecified: Secondary | ICD-10-CM | POA: Diagnosis not present

## 2023-09-16 DIAGNOSIS — M7918 Myalgia, other site: Secondary | ICD-10-CM | POA: Diagnosis not present

## 2023-09-16 DIAGNOSIS — M5481 Occipital neuralgia: Secondary | ICD-10-CM | POA: Diagnosis not present

## 2023-09-16 DIAGNOSIS — R519 Headache, unspecified: Secondary | ICD-10-CM | POA: Diagnosis not present

## 2023-09-16 DIAGNOSIS — G40219 Localization-related (focal) (partial) symptomatic epilepsy and epileptic syndromes with complex partial seizures, intractable, without status epilepticus: Secondary | ICD-10-CM | POA: Diagnosis not present

## 2023-10-07 DIAGNOSIS — G8929 Other chronic pain: Secondary | ICD-10-CM | POA: Diagnosis not present

## 2023-10-07 DIAGNOSIS — M5441 Lumbago with sciatica, right side: Secondary | ICD-10-CM | POA: Diagnosis not present

## 2023-10-07 DIAGNOSIS — M5442 Lumbago with sciatica, left side: Secondary | ICD-10-CM | POA: Diagnosis not present

## 2023-10-07 DIAGNOSIS — M5416 Radiculopathy, lumbar region: Secondary | ICD-10-CM | POA: Diagnosis not present

## 2023-10-14 ENCOUNTER — Other Ambulatory Visit: Payer: Self-pay | Admitting: Internal Medicine

## 2023-10-14 DIAGNOSIS — Z1231 Encounter for screening mammogram for malignant neoplasm of breast: Secondary | ICD-10-CM

## 2023-10-17 ENCOUNTER — Ambulatory Visit
Admission: RE | Admit: 2023-10-17 | Discharge: 2023-10-17 | Disposition: A | Discharge status: Home / Self Care | Source: Ambulatory Visit | Attending: Internal Medicine | Admitting: Internal Medicine

## 2023-10-17 DIAGNOSIS — Z1231 Encounter for screening mammogram for malignant neoplasm of breast: Secondary | ICD-10-CM | POA: Diagnosis not present

## 2023-10-29 DIAGNOSIS — M7918 Myalgia, other site: Secondary | ICD-10-CM | POA: Diagnosis not present

## 2023-10-29 DIAGNOSIS — M5481 Occipital neuralgia: Secondary | ICD-10-CM | POA: Diagnosis not present

## 2023-11-21 DIAGNOSIS — F411 Generalized anxiety disorder: Secondary | ICD-10-CM | POA: Diagnosis not present

## 2023-11-21 DIAGNOSIS — R41840 Attention and concentration deficit: Secondary | ICD-10-CM | POA: Diagnosis not present

## 2023-11-21 DIAGNOSIS — F332 Major depressive disorder, recurrent severe without psychotic features: Secondary | ICD-10-CM | POA: Diagnosis not present

## 2023-11-21 DIAGNOSIS — F5105 Insomnia due to other mental disorder: Secondary | ICD-10-CM | POA: Diagnosis not present

## 2023-12-02 DIAGNOSIS — R42 Dizziness and giddiness: Secondary | ICD-10-CM | POA: Diagnosis not present

## 2023-12-29 ENCOUNTER — Ambulatory Visit
Admission: EM | Admit: 2023-12-29 | Discharge: 2023-12-29 | Disposition: A | Discharge status: Home / Self Care | Attending: Family Medicine | Admitting: Family Medicine

## 2023-12-29 DIAGNOSIS — R202 Paresthesia of skin: Secondary | ICD-10-CM

## 2023-12-29 NOTE — ED Triage Notes (Signed)
 Pt c/o headache, eye pain, left side facial swelling and warmth x5days

## 2023-12-29 NOTE — Discharge Instructions (Signed)
 Difficult to discern between stroke and bells palsy on exam.  Recommend ER evaluation for neuroimaging.

## 2023-12-29 NOTE — ED Provider Notes (Signed)
 [facility]    CSN: [id number] Arrival date & time: 12/29/23  1340      History   Chief Complaint Chief Complaint  Patient presents with   Headache    HPI 73 year old female with below mentioned medical problems presents for evaluation of the above.  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  Follows with neurology.  Patient reports development of left-sided headache, congestion, eye watering and facial paresthesia.  Patient concerned that she may have Bell's palsy.  This is not her usual headache.  She denies any difficulty with speech.  She does note recent dizziness as well.  Pain currently 8/10 in severity.  No relieving factors.  Past Medical History:  Diagnosis Date   Anxiety    Arthritis    B12 deficiency    Depression    Epilepsy ([facility])    GERD (gastroesophageal reflux disease)    Headache    Hypercholesterolemia    Hyperlipidemia    Hypothyroidism    Hypothyroidism    IBS (irritable bowel syndrome)    Renal stones    Seizures ([facility])    Shingles    Sleep apnea    Stress incontinence    Visual field loss     There are no active problems to display for this patient.   Past Surgical History:  Procedure Laterality Date   CARPAL TUNNEL RELEASE Left    COLONOSCOPY WITH PROPOFOL  N/A 08/22/2015   Procedure: COLONOSCOPY WITH PROPOFOL ;  Surgeon: Cassie Click, MD;  Location: [facility] ENDOSCOPY;  Service: Endoscopy;  Laterality: N/A;   COLONOSCOPY WITH PROPOFOL  N/A 05/02/2020   Procedure: COLONOSCOPY WITH PROPOFOL ;  Surgeon: Shane Darling, MD;  Location: [facility] ENDOSCOPY;  Service: Endoscopy;  Laterality: N/A;   ESOPHAGOGASTRODUODENOSCOPY (EGD) WITH PROPOFOL  N/A 08/22/2015   Procedure: ESOPHAGOGASTRODUODENOSCOPY (EGD) WITH PROPOFOL ;  Surgeon: Cassie Click, MD;  Location: [facility] ENDOSCOPY;  Service: Endoscopy;  Laterality: N/A;   ESOPHAGOGASTRODUODENOSCOPY (EGD) WITH PROPOFOL  N/A 05/02/2020   Procedure: ESOPHAGOGASTRODUODENOSCOPY  (EGD) WITH PROPOFOL ;  Surgeon: Shane Darling, MD;  Location: [facility] ENDOSCOPY;  Service: Endoscopy;  Laterality: N/A;   TONSILLECTOMY      OB History   No obstetric history on file.      Home Medications    Prior to Admission medications   Medication Sig Start Date End Date Taking? Authorizing Provider  azelastine (ASTELIN) 0.1 % nasal spray Place 1 spray into both nostrils 2 (two) times daily. Use in each nostril as directed   Yes [provider]  cetirizine (ZYRTEC) 10 MG tablet Take 10 mg by mouth daily as needed for allergies.   Yes [provider]  lamoTRIgine  (LAMICTAL ) 100 MG tablet Take 100 mg by mouth 2 (two) times daily.    Yes [provider]  levothyroxine (SYNTHROID, LEVOTHROID) 75 MCG tablet Take 75 mcg by mouth daily before breakfast.   Yes [provider]  Linaclotide (LINZESS) 145 MCG CAPS capsule Take 145 mcg by mouth daily.   Yes [provider]  lovastatin (MEVACOR) 40 MG tablet Take 40 mg by mouth at bedtime.   Yes [provider]  pantoprazole (PROTONIX) 40 MG tablet Take 40 mg by mouth daily.   Yes [provider]  Polyethyl Glycol-Propyl Glycol (SYSTANE OP) Place 1-2 drops into both eyes 2 (two) times daily.   Yes [provider]  Sertraline HCl (ZOLOFT PO) Take 50 mg by mouth.   Yes [provider]  traZODone (DESYREL) 100 MG tablet Take 100  mg by mouth at bedtime as needed for sleep.   Yes [provider]  venlafaxine (EFFEXOR) 75 MG tablet Take 150 mg by mouth 2 (two) times daily.   Yes [provider]  vitamin B-12 (CYANOCOBALAMIN ) 1000 MCG tablet Take 1,000 mcg by mouth daily.   Yes [provider]  predniSONE  (DELTASONE ) 10 MG tablet 40 mg by mouth for 2 days 30 mg by mouth for 2 days 20 mg by mouth for 2 days 10 mg by mouth for 2 days. Patient not taking: Reported on 05/02/2020 07/25/17   Ainsley Houston, MD  predniSONE  (DELTASONE ) 50 MG tablet  Take 50 mg by mouth daily with breakfast.    [provider]    Family History Family History  Problem Relation Age of Onset   Breast cancer Sister 44    Social History Social History   Tobacco Use   Smoking status: Never   Smokeless tobacco: Never  Vaping Use   Vaping status: Never Used  Substance Use Topics   Alcohol  use: Yes    Alcohol /week: 2.0 standard drinks of alcohol     Types: 2 Glasses of wine per week   Drug use: Never     Allergies   Contrast media [iodinated contrast media], Bentyl [dicyclomine], Meloxicam, Metrizamide, and Sulfa antibiotics   Review of Systems Review of Systems Per HPI  Physical Exam Triage Vital Signs ED Triage Vitals [12/29/23 1408]  Encounter Vitals Group     BP (!) 156/74     Systolic BP Percentile      Diastolic BP Percentile      Pulse Rate (!) 53     Resp      Temp 98.3 F (36.8 C)     Temp Source Oral     SpO2 100 %     Weight      Height      Head Circumference      Peak Flow      Pain Score 8     Pain Loc      Pain Education      Exclude from Growth Chart    No data found.  Updated Vital Signs BP (!) 156/74 (BP Location: Left Arm)   Pulse (!) 53   Temp 98.3 F (36.8 C) (Oral)   SpO2 100%   Visual Acuity Right Eye Distance:   Left Eye Distance:   Bilateral Distance:    Right Eye Near:   Left Eye Near:    Bilateral Near:     Physical Exam Vitals and nursing note reviewed.  Constitutional:      General: She is not in acute distress. HENT:     Head: Normocephalic and atraumatic.     Nose: No rhinorrhea.  Eyes:     Conjunctiva/sclera: Conjunctivae normal.     Comments: Left eye watering.  Mild ptosis.  Cardiovascular:     Rate and Rhythm: Regular rhythm. Bradycardia present.  Pulmonary:     Effort: Pulmonary effort is normal.     Breath sounds: Normal breath sounds. No wheezing, rhonchi or rales.  Neurological:     Mental Status: She is alert.     Comments: Left eye ptosis.  No  current muscular weakness.  Normal speech.      UC Treatments / Results  Labs (all labs ordered are listed, but only abnormal results are displayed) Labs Reviewed - No data to display  EKG   Radiology No results found.  Procedures Procedures (including critical care time)  Medications Ordered in UC Medications - No data to display  Initial Impression / Assessment and Plan / UC Course  I have reviewed the triage vital signs and the nursing notes.  Pertinent labs & imaging results that were available during my care of the patient were reviewed by me and considered in my medical decision making (see chart for details).    73 year old female presents with headache, respiratory symptoms, and also facial paresthesia.  Clinically, it is difficult to discern whether this is Bell's palsy.  She is able to symmetrically raise her eyebrows minimally.  She does have some left-sided ptosis.  Given her medical history and current presentation, I recommend that she go to the hospital for evaluation and neuroimaging.  Final Clinical Impressions(s) / UC Diagnoses   Final diagnoses:  Facial paresthesia     Discharge Instructions      Difficult to discern between stroke and bells palsy on exam.  Recommend ER evaluation for neuroimaging.   ED Prescriptions   None    PDMP not reviewed this encounter.   Kaleo Condrey G, [place] 12/29/23 [phone]

## 2023-12-31 DIAGNOSIS — Z86018 Personal history of other benign neoplasm: Secondary | ICD-10-CM | POA: Diagnosis not present

## 2023-12-31 DIAGNOSIS — Z872 Personal history of diseases of the skin and subcutaneous tissue: Secondary | ICD-10-CM | POA: Diagnosis not present

## 2023-12-31 DIAGNOSIS — L821 Other seborrheic keratosis: Secondary | ICD-10-CM | POA: Diagnosis not present

## 2023-12-31 DIAGNOSIS — L578 Other skin changes due to chronic exposure to nonionizing radiation: Secondary | ICD-10-CM | POA: Diagnosis not present

## 2023-12-31 DIAGNOSIS — L570 Actinic keratosis: Secondary | ICD-10-CM | POA: Diagnosis not present

## 2024-01-01 DIAGNOSIS — J329 Chronic sinusitis, unspecified: Secondary | ICD-10-CM | POA: Diagnosis not present

## 2024-01-14 DIAGNOSIS — G4733 Obstructive sleep apnea (adult) (pediatric): Secondary | ICD-10-CM | POA: Diagnosis not present

## 2024-01-14 DIAGNOSIS — G40219 Localization-related (focal) (partial) symptomatic epilepsy and epileptic syndromes with complex partial seizures, intractable, without status epilepticus: Secondary | ICD-10-CM | POA: Diagnosis not present

## 2024-01-14 DIAGNOSIS — G20C Parkinsonism, unspecified: Secondary | ICD-10-CM | POA: Diagnosis not present

## 2024-01-14 DIAGNOSIS — R519 Headache, unspecified: Secondary | ICD-10-CM | POA: Diagnosis not present

## 2024-01-14 DIAGNOSIS — E559 Vitamin D deficiency, unspecified: Secondary | ICD-10-CM | POA: Diagnosis not present

## 2024-02-19 DIAGNOSIS — N1831 Chronic kidney disease, stage 3a: Secondary | ICD-10-CM | POA: Diagnosis not present

## 2024-02-19 DIAGNOSIS — E7800 Pure hypercholesterolemia, unspecified: Secondary | ICD-10-CM | POA: Diagnosis not present

## 2024-02-19 DIAGNOSIS — E538 Deficiency of other specified B group vitamins: Secondary | ICD-10-CM | POA: Diagnosis not present

## 2024-02-19 DIAGNOSIS — R7303 Prediabetes: Secondary | ICD-10-CM | POA: Diagnosis not present

## 2024-02-19 DIAGNOSIS — E034 Atrophy of thyroid (acquired): Secondary | ICD-10-CM | POA: Diagnosis not present

## 2024-03-02 DIAGNOSIS — Z0000 Encounter for general adult medical examination without abnormal findings: Secondary | ICD-10-CM | POA: Diagnosis not present

## 2024-03-02 DIAGNOSIS — M519 Unspecified thoracic, thoracolumbar and lumbosacral intervertebral disc disorder: Secondary | ICD-10-CM | POA: Diagnosis not present

## 2024-03-02 DIAGNOSIS — E7800 Pure hypercholesterolemia, unspecified: Secondary | ICD-10-CM | POA: Diagnosis not present

## 2024-03-02 DIAGNOSIS — G20C Parkinsonism, unspecified: Secondary | ICD-10-CM | POA: Diagnosis not present

## 2024-03-02 DIAGNOSIS — E538 Deficiency of other specified B group vitamins: Secondary | ICD-10-CM | POA: Diagnosis not present

## 2024-03-02 DIAGNOSIS — K760 Fatty (change of) liver, not elsewhere classified: Secondary | ICD-10-CM | POA: Diagnosis not present

## 2024-03-02 DIAGNOSIS — E034 Atrophy of thyroid (acquired): Secondary | ICD-10-CM | POA: Diagnosis not present

## 2024-03-02 DIAGNOSIS — F3342 Major depressive disorder, recurrent, in full remission: Secondary | ICD-10-CM | POA: Diagnosis not present

## 2024-03-02 DIAGNOSIS — Z1331 Encounter for screening for depression: Secondary | ICD-10-CM | POA: Diagnosis not present

## 2024-03-02 DIAGNOSIS — N1831 Chronic kidney disease, stage 3a: Secondary | ICD-10-CM | POA: Diagnosis not present

## 2024-03-02 DIAGNOSIS — I700 Atherosclerosis of aorta: Secondary | ICD-10-CM | POA: Diagnosis not present

## 2024-03-02 DIAGNOSIS — G40219 Localization-related (focal) (partial) symptomatic epilepsy and epileptic syndromes with complex partial seizures, intractable, without status epilepticus: Secondary | ICD-10-CM | POA: Diagnosis not present

## 2024-03-02 DIAGNOSIS — R7303 Prediabetes: Secondary | ICD-10-CM | POA: Diagnosis not present

## 2024-03-03 DIAGNOSIS — F411 Generalized anxiety disorder: Secondary | ICD-10-CM | POA: Diagnosis not present

## 2024-03-03 DIAGNOSIS — F332 Major depressive disorder, recurrent severe without psychotic features: Secondary | ICD-10-CM | POA: Diagnosis not present

## 2024-03-03 DIAGNOSIS — F5105 Insomnia due to other mental disorder: Secondary | ICD-10-CM | POA: Diagnosis not present

## 2024-03-03 DIAGNOSIS — R41840 Attention and concentration deficit: Secondary | ICD-10-CM | POA: Diagnosis not present

## 2024-03-04 ENCOUNTER — Encounter (INDEPENDENT_AMBULATORY_CARE_PROVIDER_SITE_OTHER): Payer: Self-pay | Admitting: Otolaryngology

## 2024-03-04 ENCOUNTER — Ambulatory Visit (INDEPENDENT_AMBULATORY_CARE_PROVIDER_SITE_OTHER): Admitting: Otolaryngology

## 2024-03-04 VITALS — BP 126/78 | HR 58 | Wt 130.2 lb

## 2024-03-04 DIAGNOSIS — J342 Deviated nasal septum: Secondary | ICD-10-CM | POA: Diagnosis not present

## 2024-03-04 DIAGNOSIS — R0981 Nasal congestion: Secondary | ICD-10-CM

## 2024-03-04 DIAGNOSIS — G4733 Obstructive sleep apnea (adult) (pediatric): Secondary | ICD-10-CM | POA: Diagnosis not present

## 2024-03-04 DIAGNOSIS — Z789 Other specified health status: Secondary | ICD-10-CM

## 2024-03-04 DIAGNOSIS — J343 Hypertrophy of nasal turbinates: Secondary | ICD-10-CM

## 2024-03-04 DIAGNOSIS — J3089 Other allergic rhinitis: Secondary | ICD-10-CM

## 2024-03-04 DIAGNOSIS — Z91198 Patient's noncompliance with other medical treatment and regimen for other reason: Secondary | ICD-10-CM | POA: Diagnosis not present

## 2024-03-04 MED ORDER — AZELASTINE HCL 0.1 % NA SOLN: 1.0000 | Freq: Two times a day (BID) | NASAL | 6 refills | Status: AC

## 2024-03-04 MED ORDER — LEVOCETIRIZINE DIHYDROCHLORIDE 5 MG PO TABS: 5.0000 mg | ORAL_TABLET | Freq: Every evening | ORAL | 3 refills | Status: AC

## 2024-03-04 NOTE — Patient Instructions (Addendum)
 It was very nice to meet you today,   Please see the following link for the [facility] program   [email or web address]  This website has information about how you can connect to other patients who have had [employer] procedure  [employer] [facility] Nasal Saline Rinse   - start nasal saline rinses with [employer] available over the counter or online to help with nasal congestion

## 2024-03-04 NOTE — Progress Notes (Signed)
 ENT CONSULT:  Reason for Consult: OSA CPAP intolerance    HPI: Discussed the use of AI scribe software for clinical note transcription with the patient, who gave verbal consent to proceed.  History of Present Illness Morgan Good is a 73 year old female with mild sleep apnea who presents with chronic nasal congestion and CPAP intolerance. She was referred by a sleep doctor at [facility] for evaluation of nasal congestion and CPAP intolerance.  She experiences nasal congestion and uses Astelin  nasal spray and saline nasal drops for symptom management. She also takes Zyrtec intermittently. She has not undergone environmental allergy testing. She experiences claustrophobia with CPAP use, finding it uncomfortable when lying on her side and continues to snore when lying on her back.  A sleep study in 2022 indicated moderate sleep apnea. Her weight has remained stable at around 130 pounds since then. We do not have a report of her sleep study available for review. Her initial sleep study was conducted in a lab setting approximately 15 years ago at [facility]. She has not had an in-lab sleep study since then.   She has a history of insomnia and takes trazodone to aid sleep. No recent weight changes and she is not on blood thinners.  She reports chronic sinus infections, with a significant episode in May 2025, treated with a Z-Pak and steroids. She describes swelling on the side of her head and drooping of her eyelid during these episodes. MRI brain 02/2020 showed normal sinuses    Records Reviewed:  Seen for sinusitis given Zpack and steroids on 01/01/24 Assessment and Plan 1. Sinusitis, unspecified chronicity, unspecified location Seen at urgent care and combination of symptoms suggested possible Bell's palsy or CVA though unlikely. Urgent care recommended ER visit. Also today I offered CT scan for further evaluation and patient wants to wait. She has seen gradual improvement  over the last 3 to 4 days. - predniSONE  (DELTASONE ) 10 MG tablet; 40mg  x 2 days, 30mg  x 2days, 20mg  x 2days, 10mg  x 2days - azithromycin (ZITHROMAX) 250 MG tablet; Take 2 tablets (500mg ) by mouth on Day 1. Take 1 tablet (250mg ) by mouth on Days 2-5.      Past Medical History:  Diagnosis Date   Anxiety    Arthritis    B12 deficiency    Depression    Epilepsy (HCC)    GERD (gastroesophageal reflux disease)    Headache    Hypercholesterolemia    Hyperlipidemia    Hypothyroidism    Hypothyroidism    IBS (irritable bowel syndrome)    Renal stones    Seizures (HCC)    Shingles    Sleep apnea    Stress incontinence    Visual field loss     Past Surgical History:  Procedure Laterality Date   CARPAL TUNNEL RELEASE Left    COLONOSCOPY WITH PROPOFOL  N/A 08/22/2015   Procedure: COLONOSCOPY WITH PROPOFOL ;  Surgeon: Lamar ONEIDA Holmes, MD;  Location: [facility] ENDOSCOPY;  Service: Endoscopy;  Laterality: N/A;   COLONOSCOPY WITH PROPOFOL  N/A 05/02/2020   Procedure: COLONOSCOPY WITH PROPOFOL ;  Surgeon: Maryruth Ole ONEIDA, MD;  Location: [facility] ENDOSCOPY;  Service: Endoscopy;  Laterality: N/A;   ESOPHAGOGASTRODUODENOSCOPY (EGD) WITH PROPOFOL  N/A 08/22/2015   Procedure: ESOPHAGOGASTRODUODENOSCOPY (EGD) WITH PROPOFOL ;  Surgeon: Lamar ONEIDA Holmes, MD;  Location: [facility] ENDOSCOPY;  Service: Endoscopy;  Laterality: N/A;   ESOPHAGOGASTRODUODENOSCOPY (EGD) WITH PROPOFOL  N/A 05/02/2020   Procedure: ESOPHAGOGASTRODUODENOSCOPY (EGD) WITH PROPOFOL ;  Surgeon: Maryruth Ole ONEIDA, MD;  Location:  [facility] ENDOSCOPY;  Service: Endoscopy;  Laterality: N/A;   TONSILLECTOMY      Family History  Problem Relation Age of Onset   Breast cancer Sister 27    Social History:  reports that she has never smoked. She has never used smokeless tobacco. She reports current alcohol  use of about 2.0 standard drinks of alcohol  per week. She reports that she does not use drugs.  Allergies:  Allergies  Allergen Reactions   Contrast  Media [Iodinated Contrast Media] Hives    Hives and sneezing even after premedication.    Bentyl [Dicyclomine] Other (See Comments)    Dizziness   Meloxicam Nausea And Vomiting   Metrizamide Hives   Sulfa Antibiotics Itching    Medications: I have reviewed the patient's current medications.  The PMH, PSH, Medications, Allergies, and SH were reviewed and updated.  ROS: Constitutional: Negative for fever, weight loss and weight gain. Cardiovascular: Negative for chest pain and dyspnea on exertion. Respiratory: Is not experiencing shortness of breath at rest. Gastrointestinal: Negative for nausea and vomiting. Neurological: Negative for headaches. Psychiatric: The patient is not nervous/anxious  Blood pressure 126/78, pulse (!) 58, weight 130 lb 3.2 oz (59.1 kg), SpO2 95%. Body mass index is 21.67 kg/m.  PHYSICAL EXAM:  Exam: General: Well-developed, well-nourished Communication and Voice: Clear pitch and clarity Respiratory Respiratory effort: Equal inspiration and expiration without stridor Cardiovascular Peripheral Vascular: Warm extremities with equal color/perfusion Eyes: No nystagmus with equal extraocular motion bilaterally Neuro/Psych/Balance: Patient oriented to person, place, and time; Appropriate mood and affect; Gait is intact with no imbalance; Cranial nerves I-XII are intact Head and Face Inspection: Normocephalic and atraumatic without mass or lesion Palpation: Facial skeleton intact without bony stepoffs Salivary Glands: No mass or tenderness Facial Strength: Facial motility symmetric and full bilaterally ENT Pinna: External ear intact and fully developed External canal: Canal is patent with intact skin Tympanic Membrane: Clear and mobile External Nose: No scar or anatomic deformity Internal Nose: Septum is S-shaped. No polyp, or purulence. Mucosal edema and erythema present.  Bilateral inferior turbinate hypertrophy.  Lips, Teeth, and gums: Mucosa and  teeth intact and viable TMJ: No pain to palpation with full mobility Oral cavity/oropharynx: No erythema or exudate, no lesions present Nasopharynx: No mass or lesion with intact mucosa Hypopharynx: Intact mucosa without pooling of secretions Neck Neck and Trachea: Midline trachea without mass or lesion Thyroid : No mass or nodularity Lymphatics: No lymphadenopathy  Procedure:   PROCEDURE NOTE: nasal endoscopy  Preoperative diagnosis: chronic sinusitis symptoms  Postoperative diagnosis: same  Procedure: Diagnostic nasal endoscopy (68768)  Surgeon: Elena Larry, M.D.  Anesthesia: Topical lidocaine  and Afrin  H&P REVIEW: The patient's history and physical were reviewed today prior to procedure. All medications were reviewed and updated as well. Complications: None Condition is stable throughout exam Indications and consent: The patient presents with symptoms of chronic sinusitis not responding to previous therapies. All the risks, benefits, and potential complications were reviewed with the patient preoperatively and informed consent was obtained. The time out was completed with confirmation of the correct procedure.   Procedure: The patient was seated upright in the clinic. Topical lidocaine  and Afrin were applied to the nasal cavity. After adequate anesthesia had occurred, the rigid nasal endoscope was passed into the nasal cavity. The nasal mucosa, turbinates, septum, and sinus drainage pathways were visualized bilaterally. This revealed no purulence or significant secretions that might be cultured. There were was no polyps or sites of significant inflammation. The mucosa was intact and there  was no crusting present. The scope was then slowly withdrawn and the patient tolerated the procedure well. There were no complications or blood loss.      Studies Reviewed: Sleep study 2022, AHI was in category of moderate   MRI brain 03/02/2020 Sinuses/Orbits: Normal orbits. Clear  paranasal sinuses. No mastoid effusion.   Other: None.   IMPRESSION: Multifocal white matter signal abnormalities are grossly unchanged since prior exam. Differential includes demyelinating foci, chronic microvascular ischemic changes, post infectious/inflammatory sequela and less likely vasculopathy.  Assessment/Plan: Encounter Diagnoses  Name Primary?   Obstructive sleep apnea Yes   Intolerance of continuous positive airway pressure (CPAP) ventilation    Environmental and seasonal allergies    Nasal septal deviation    Hypertrophy of both inferior nasal turbinates    Chronic nasal congestion     Assessment and Plan Assessment & Plan Obstructive sleep apnea with intolerance to CPAP Moderate obstructive sleep apnea with CPAP intolerance due to claustrophobia. Last sleep study in 2022, results not available for review. BMI within range. Discussed Inspire implant as an alternative, requiring a recent sleep study in the last 2 years and specific criteria, including absence of central apneas. Explained procedure involves two incisions and synchronizes tongue movement with breathing. She is ambivalent and will research further. - Provide information on Inspire implant. - Advise her to conduct further research on Inspire implant. - Instruct her to contact clinic if she decides to pursue Inspire implant. - Advise repeat sleep study to assess candidacy for [facility] implant.  Chronic nasal congestion and septal deviation Chronic nasal congestion with septal deviation with S-shaped septum and ITH on nasal endoscopy today. Deviation not severe and unlikely to resolve CPAP intolerance since her main issue is claustrophobia with the mask in place. Continued Astelin  and Xyzal  recommended for allergy management. Nasal saline rinses advised for congestion and dryness. - Continue Astelin  nasal spray. - Prescribed Xyzal  for allergy management. Stop Zyrtec.  - Advised nasal saline rinses to alleviate  congestion and post-nasal drainage.  History of recurrent sinus infections Recurrent sinus infections with recent severe episode. Previous MRI showed clear sinuses, indicating exacerbation of chronic nasal congestion rather than true sinus infection. Allergy testing considered to identify triggers and assess candidacy for allergy shots. - Refer for allergy testing to assess candidacy for allergy shots.  She will research Inspire and will call us  if she decides to pursue it. She will require a new sleep study if she would like to be evaluated for Inspire  Thank you for allowing me to participate in the care of this patient. Please do not hesitate to contact me with any questions or concerns.   Elena Larry, MD Otolaryngology [facility] Phone: [phone] Fax: [phone]    03/04/2024, 2:23 PM

## 2024-04-01 DIAGNOSIS — R42 Dizziness and giddiness: Secondary | ICD-10-CM | POA: Diagnosis not present

## 2024-04-01 DIAGNOSIS — H903 Sensorineural hearing loss, bilateral: Secondary | ICD-10-CM | POA: Diagnosis not present

## 2024-04-28 DIAGNOSIS — H2513 Age-related nuclear cataract, bilateral: Secondary | ICD-10-CM | POA: Diagnosis not present

## 2024-04-28 DIAGNOSIS — H538 Other visual disturbances: Secondary | ICD-10-CM | POA: Diagnosis not present

## 2024-04-28 DIAGNOSIS — H43813 Vitreous degeneration, bilateral: Secondary | ICD-10-CM | POA: Diagnosis not present

## 2024-04-28 DIAGNOSIS — H40053 Ocular hypertension, bilateral: Secondary | ICD-10-CM | POA: Diagnosis not present

## 2024-05-04 DIAGNOSIS — R109 Unspecified abdominal pain: Secondary | ICD-10-CM | POA: Diagnosis not present

## 2024-05-04 DIAGNOSIS — R112 Nausea with vomiting, unspecified: Secondary | ICD-10-CM | POA: Diagnosis not present

## 2024-05-19 ENCOUNTER — Inpatient Hospital Stay: Admit: 2024-05-19

## 2024-05-19 ENCOUNTER — Other Ambulatory Visit: Payer: Self-pay | Admitting: Physician Assistant

## 2024-05-19 ENCOUNTER — Ambulatory Visit
Admission: RE | Admit: 2024-05-19 | Discharge: 2024-05-19 | Disposition: A | Discharge status: Home / Self Care | Source: Ambulatory Visit | Attending: Physician Assistant | Admitting: Physician Assistant

## 2024-05-19 DIAGNOSIS — R112 Nausea with vomiting, unspecified: Secondary | ICD-10-CM | POA: Insufficient documentation

## 2024-05-19 DIAGNOSIS — R1011 Right upper quadrant pain: Secondary | ICD-10-CM | POA: Insufficient documentation

## 2024-05-19 DIAGNOSIS — D259 Leiomyoma of uterus, unspecified: Secondary | ICD-10-CM | POA: Diagnosis not present

## 2024-05-19 DIAGNOSIS — R109 Unspecified abdominal pain: Secondary | ICD-10-CM | POA: Diagnosis not present

## 2024-05-20 ENCOUNTER — Other Ambulatory Visit: Payer: Self-pay | Admitting: Physician Assistant

## 2024-05-20 DIAGNOSIS — R1011 Right upper quadrant pain: Secondary | ICD-10-CM

## 2024-05-20 DIAGNOSIS — R112 Nausea with vomiting, unspecified: Secondary | ICD-10-CM

## 2024-05-28 DIAGNOSIS — K5904 Chronic idiopathic constipation: Secondary | ICD-10-CM | POA: Diagnosis not present

## 2024-06-23 DIAGNOSIS — F411 Generalized anxiety disorder: Secondary | ICD-10-CM | POA: Diagnosis not present

## 2024-06-23 DIAGNOSIS — F5105 Insomnia due to other mental disorder: Secondary | ICD-10-CM | POA: Diagnosis not present

## 2024-06-23 DIAGNOSIS — R41840 Attention and concentration deficit: Secondary | ICD-10-CM | POA: Diagnosis not present

## 2024-06-23 DIAGNOSIS — F332 Major depressive disorder, recurrent severe without psychotic features: Secondary | ICD-10-CM | POA: Diagnosis not present
